# Patient Record
Sex: Female | Born: 1945 | Race: White | Hispanic: No | Marital: Married | Smoking: Never smoker
Health system: Southern US, Community
[De-identification: ages and names within clinical notes are randomized; demographics above are authoritative.]

## PROBLEM LIST (undated history)

## (undated) DIAGNOSIS — G44219 Episodic tension-type headache, not intractable: Secondary | ICD-10-CM

## (undated) DIAGNOSIS — E7800 Pure hypercholesterolemia, unspecified: Secondary | ICD-10-CM

## (undated) DIAGNOSIS — E785 Hyperlipidemia, unspecified: Secondary | ICD-10-CM

## (undated) DIAGNOSIS — E559 Vitamin D deficiency, unspecified: Secondary | ICD-10-CM

## (undated) DIAGNOSIS — J45909 Unspecified asthma, uncomplicated: Secondary | ICD-10-CM

## (undated) DIAGNOSIS — G250 Essential tremor: Secondary | ICD-10-CM

## (undated) DIAGNOSIS — R5381 Other malaise: Secondary | ICD-10-CM

## (undated) HISTORY — DX: Other malaise: R53.81

## (undated) HISTORY — DX: Hyperlipidemia, unspecified: E78.5

## (undated) HISTORY — DX: Essential tremor: G25.0

## (undated) HISTORY — PX: TONSILLECTOMY: SUR1361

## (undated) HISTORY — DX: Unspecified asthma, uncomplicated: J45.909

## (undated) HISTORY — DX: Episodic tension-type headache, not intractable: G44.219

## (undated) HISTORY — DX: Vitamin D deficiency, unspecified: E55.9

---

## 2011-11-16 DIAGNOSIS — M129 Arthropathy, unspecified: Secondary | ICD-10-CM | POA: Insufficient documentation

## 2011-11-16 DIAGNOSIS — E785 Hyperlipidemia, unspecified: Secondary | ICD-10-CM | POA: Insufficient documentation

## 2013-02-23 DIAGNOSIS — J4520 Mild intermittent asthma, uncomplicated: Secondary | ICD-10-CM | POA: Insufficient documentation

## 2013-02-23 DIAGNOSIS — J3089 Other allergic rhinitis: Secondary | ICD-10-CM | POA: Insufficient documentation

## 2017-12-28 DIAGNOSIS — G250 Essential tremor: Secondary | ICD-10-CM | POA: Insufficient documentation

## 2018-07-24 DIAGNOSIS — E559 Vitamin D deficiency, unspecified: Secondary | ICD-10-CM | POA: Insufficient documentation

## 2018-07-24 DIAGNOSIS — R5381 Other malaise: Secondary | ICD-10-CM | POA: Insufficient documentation

## 2018-09-18 ENCOUNTER — Emergency Department (HOSPITAL_BASED_OUTPATIENT_CLINIC_OR_DEPARTMENT_OTHER)
Admission: EM | Admit: 2018-09-18 | Discharge: 2018-09-18 | Disposition: A | Discharge status: Home / Self Care | Payer: Medicare Other | Attending: Emergency Medicine | Admitting: Emergency Medicine

## 2018-09-18 ENCOUNTER — Encounter (HOSPITAL_BASED_OUTPATIENT_CLINIC_OR_DEPARTMENT_OTHER): Payer: Self-pay | Admitting: *Deleted

## 2018-09-18 ENCOUNTER — Other Ambulatory Visit: Payer: Self-pay

## 2018-09-18 ENCOUNTER — Emergency Department (HOSPITAL_BASED_OUTPATIENT_CLINIC_OR_DEPARTMENT_OTHER): Payer: Medicare Other

## 2018-09-18 DIAGNOSIS — Y9373 Activity, racquet and hand sports: Secondary | ICD-10-CM | POA: Insufficient documentation

## 2018-09-18 DIAGNOSIS — S6992XA Unspecified injury of left wrist, hand and finger(s), initial encounter: Secondary | ICD-10-CM | POA: Diagnosis present

## 2018-09-18 DIAGNOSIS — S62115A Nondisplaced fracture of triquetrum [cuneiform] bone, left wrist, initial encounter for closed fracture: Secondary | ICD-10-CM | POA: Diagnosis not present

## 2018-09-18 DIAGNOSIS — S32030A Wedge compression fracture of third lumbar vertebra, initial encounter for closed fracture: Secondary | ICD-10-CM | POA: Insufficient documentation

## 2018-09-18 DIAGNOSIS — W19XXXA Unspecified fall, initial encounter: Secondary | ICD-10-CM

## 2018-09-18 DIAGNOSIS — W010XXA Fall on same level from slipping, tripping and stumbling without subsequent striking against object, initial encounter: Secondary | ICD-10-CM | POA: Insufficient documentation

## 2018-09-18 DIAGNOSIS — Y92312 Tennis court as the place of occurrence of the external cause: Secondary | ICD-10-CM | POA: Insufficient documentation

## 2018-09-18 DIAGNOSIS — Y999 Unspecified external cause status: Secondary | ICD-10-CM | POA: Insufficient documentation

## 2018-09-18 HISTORY — DX: Pure hypercholesterolemia, unspecified: E78.00

## 2018-09-18 MED ORDER — ONDANSETRON 4 MG PO TBDP
4.0000 mg | ORAL_TABLET | Freq: Once | ORAL | Status: AC
  Administered 2018-09-18: 4 mg via ORAL
  Filled 2018-09-18: qty 1

## 2018-09-18 MED ORDER — IBUPROFEN 400 MG PO TABS
400.0000 mg | ORAL_TABLET | Freq: Once | ORAL | Status: AC
  Administered 2018-09-18: 400 mg via ORAL
  Filled 2018-09-18: qty 1

## 2018-09-18 MED ORDER — ACETAMINOPHEN 325 MG PO TABS
650.0000 mg | ORAL_TABLET | Freq: Once | ORAL | Status: AC
  Administered 2018-09-18: 650 mg via ORAL
  Filled 2018-09-18: qty 2

## 2018-09-18 NOTE — ED Notes (Signed)
Patient transported to X-ray 

## 2018-09-18 NOTE — ED Notes (Signed)
ED Provider at bedside. 

## 2018-09-18 NOTE — ED Triage Notes (Signed)
She was playing tennis today and her feet got tangled up and she fell backward. Pain to the back of her head, her mid back and her left wrist. No LOC.

## 2018-09-18 NOTE — Discharge Instructions (Signed)
Take 2 ibuprofen and 2 extra strength tylenol 3 times a day for pain control

## 2018-09-18 NOTE — ED Provider Notes (Signed)
[facility] EMERGENCY DEPARTMENT Provider Note   CSN: [id number] Arrival date & time: 09/18/18  1609     History   Chief Complaint Chief Complaint  Patient presents with  . Fall    HPI Briana Lee is a 72 y.o. female.  The history is provided by the patient.  Fall  This is a new problem. The current episode started 6 to 12 hours ago. The problem occurs constantly. The problem has been gradually worsening. Pertinent negatives include no abdominal pain, no headaches and no shortness of breath. Associated symptoms comments: Back pain and left hand/wrist pain.  Patient is a healthy 72 year old female presenting today after she was playing tennis and she stepped backwards tripping causing her to fall backwards onto her back and left hand.  Since that time she has had significant pain that is only worsened throughout the day.  She did not have any loss of consciousness and denies any chest pain, shortness of breath or abdominal pain.  She denies any numbness or tingling in her legs or difficulty walking..    Past Medical History:  Diagnosis Date  . High cholesterol     There are no active problems to display for this patient.   Past Surgical History:  Procedure Laterality Date  . TONSILLECTOMY       OB History   No obstetric history on file.      Home Medications    Prior to Admission medications   Medication Sig Start Date End Date Taking? Authorizing Provider  atorvastatin (LIPITOR) 20 MG tablet Take 20 mg by mouth daily.   Yes [provider]    Family History No family history on file.  Social History Social History   Tobacco Use  . Smoking status: Never Smoker  . Smokeless tobacco: Never Used  Substance Use Topics  . Alcohol use: Never    Frequency: Never  . Drug use: Never     Allergies   Codeine; Penicillins; and Sulfa antibiotics   Review of Systems Review of Systems  Respiratory: Negative for shortness of breath.     Gastrointestinal: Negative for abdominal pain.  Neurological: Negative for headaches.  All other systems reviewed and are negative.    Physical Exam Updated Vital Signs BP (!) 184/92 (BP Location: Left Arm)   Pulse 76   Temp 98.2 F (36.8 C) (Oral)   Resp 16   Ht 5\' 6"  (1.676 m)   Wt 72.6 kg   SpO2 100%   BMI 25.82 kg/m   Physical Exam Vitals signs and nursing note reviewed.  Constitutional:      General: She is not in acute distress.    Appearance: Normal appearance. She is normal weight.  HENT:     Head: Normocephalic.  Eyes:     Pupils: Pupils are equal, round, and reactive to light.  Neck:     Musculoskeletal: Normal range of motion and neck supple. No muscular tenderness.  Cardiovascular:     Rate and Rhythm: Normal rate.     Pulses: Normal pulses.  Pulmonary:     Effort: Pulmonary effort is normal.  Abdominal:     General: Abdomen is flat. There is no distension.     Tenderness: There is no abdominal tenderness. There is no right CVA tenderness, left CVA tenderness, guarding or rebound.  Musculoskeletal:     Left wrist: Normal.     Lumbar back: She exhibits bony tenderness and pain. She exhibits normal range of motion, no spasm and  normal pulse.       Back:     Left hand: She exhibits tenderness and bony tenderness. She exhibits normal range of motion, normal capillary refill and no deformity.       Hands:  Skin:    General: Skin is warm.     Capillary Refill: Capillary refill takes less than 2 seconds.  Neurological:     General: No focal deficit present.     Mental Status: She is alert. Mental status is at baseline.     Sensory: No sensory deficit.     Motor: No weakness.     Gait: Gait normal.  Psychiatric:        Mood and Affect: Mood normal.        Behavior: Behavior normal.      ED Treatments / Results  Labs (all labs ordered are listed, but only abnormal results are displayed) Labs Reviewed - No data to  display  EKG None  Radiology Dg Lumbar Spine Complete  Result Date: 09/18/2018 CLINICAL DATA:  Pain after fall EXAM: LUMBAR SPINE - COMPLETE 4+ VIEW COMPARISON:  None. FINDINGS: Five non rib-bearing lumbar type vertebra. Moderate to marked degenerative change L5-S1. Suspected acute superior endplate fracture at L3. Age indeterminate moderate to marked compression fracture T12 with close to 50% loss of height anteriorly. Mild kyphosis at this level. 3 mm retropulsion. IMPRESSION: 1. Age indeterminate moderate to marked compression fracture T12 with 3 mm retropulsion 2. Suspected acute superior endplate fracture at L3 Electronically Signed   By: Jasmine PangKim  Fujinaga M.D.   On: 09/18/2018 18:47   Dg Wrist Complete Left  Result Date: 09/18/2018 CLINICAL DATA:  Fall. EXAM: LEFT WRIST - COMPLETE 3+ VIEW COMPARISON:  None. FINDINGS: Ossific fragment dorsal to the carpal bones, likely a small triquetrum fracture. No additional fracture. No dislocation. Joint spaces are preserved. Osteopenia. Mild dorsal wrist soft tissue swelling. IMPRESSION: 1. Small triquetrum fracture with overlying soft tissue swelling. Electronically Signed   By: Obie DredgeWilliam T Derry M.D.   On: 09/18/2018 17:17    Procedures Procedures (including critical care time)  Medications Ordered in ED Medications  acetaminophen (TYLENOL) tablet 650 mg (650 mg Oral Given 09/18/18 1628)  ibuprofen (ADVIL,MOTRIN) tablet 400 mg (400 mg Oral Given 09/18/18 1628)  ondansetron (ZOFRAN-ODT) disintegrating tablet 4 mg (4 mg Oral Given 09/18/18 1628)     Initial Impression / Assessment and Plan / ED Course  I have reviewed the triage vital signs and the nursing notes.  Pertinent labs & imaging results that were available during my care of the patient were reviewed by me and considered in my medical decision making (see chart for details).     Patient with a fall today when she was stepping backward and tripped over the carpet.  She has had back  pain and left hand pain since that time.  sHe is neurovascularly intact at this time and denies any head injury or loss of consciousness.  X-rays today show a suspected acute superior endplate fracture of L3 which is exactly where she is having point tenderness.  It also showed a moderate to marked compression fracture of T12 with 3 mm retropulsion but she states she injured that years ago from falling down the stairs and has no tenderness in that location.  Also x-ray of the hand showed a nondisplaced small triquetral fracture and patient was placed in a splint.  She was able to ambulate and no numbness or weakness of the legs.  She had significant  improvement after ibuprofen and Tylenol.  She will follow-up with Dr. Pearletha ForgeHudnall for casting and further evaluation of her back to ensure it is getting better.  Final Clinical Impressions(s) / ED Diagnoses   Final diagnoses:  Fall, initial encounter  Closed nondisplaced fracture of triquetrum of left wrist, initial encounter  Closed wedge compression fracture of third lumbar vertebra, initial encounter [facility])    ED Discharge Orders    None       Gwyneth SproutPlunkett, Kamry Faraci, MD 09/18/18 [phone]

## 2018-09-20 ENCOUNTER — Encounter: Payer: Self-pay | Admitting: Family Medicine

## 2018-09-20 ENCOUNTER — Ambulatory Visit: Payer: Medicare Other | Admitting: Family Medicine

## 2018-09-20 ENCOUNTER — Other Ambulatory Visit: Payer: Self-pay | Admitting: Family Medicine

## 2018-09-20 VITALS — BP 154/83 | HR 94 | Ht 66.0 in | Wt 160.0 lb

## 2018-09-20 DIAGNOSIS — S6992XA Unspecified injury of left wrist, hand and finger(s), initial encounter: Secondary | ICD-10-CM

## 2018-09-20 DIAGNOSIS — S32030A Wedge compression fracture of third lumbar vertebra, initial encounter for closed fracture: Secondary | ICD-10-CM | POA: Diagnosis not present

## 2018-09-20 MED ORDER — MELOXICAM 7.5 MG PO TABS: 7.5000 mg | ORAL_TABLET | Freq: Every day | ORAL | 1 refills | Status: DC

## 2018-09-20 NOTE — Patient Instructions (Signed)
You have an avulsion fracture of your triquetrum. These typically heal very well with just a wrist brace over 4-6 weeks. Ok to take this off to wash the area and ice it. Icing 15 minutes at a time 3-4 times a day. Elevate above your heart when possible for the swelling.  You have a compression fracture of your lumbar spine. Tylenol 500mg  1-2 tabs three times a day. Try meloxicam 7.5mg  daily with food for pain and inflammation. Salon pas patches to help as well. Consider aspercreme and/or biofreeze topically also. Follow up with me in 2 weeks - we will repeat x-rays on your back at that time.  You don't need repeat x-rays on your wrist.

## 2018-09-21 ENCOUNTER — Encounter: Payer: Self-pay | Admitting: Family Medicine

## 2018-09-21 NOTE — Progress Notes (Signed)
PCP: Mattie MarlinWillett, Ralph, MD  Subjective:   HPI: Patient is a 73 y.o. female here for left wrist injury.  Patient reports on 12/31 she was playing tennis, moving backwards when she tripped and fell sustaining FOOSH injury to left wrist but also fell directly onto low back and struck her head. Pain in left wrist dorsally is 2/10 level, a soreness. Pain in low back is worse, hard to get comfortable. Taking tylenol and ibuprofen which help some but th eibuprofen is bothering her stomach. No radiation into extremities. No numbness or tingling. No bowel/bladder dysfunction. Bruising of left wrist. Wearing wrist splint.  Past Medical History:  Diagnosis Date  . High cholesterol     Current Outpatient Medications on File Prior to Visit  Medication Sig Dispense Refill  . atorvastatin (LIPITOR) 20 MG tablet Take 20 mg by mouth daily.    . cetirizine (ZYRTEC) 10 MG tablet Take 10 mg by mouth daily.     No current facility-administered medications on file prior to visit.     Past Surgical History:  Procedure Laterality Date  . TONSILLECTOMY      Allergies  Allergen Reactions  . Codeine Nausea And Vomiting  . Penicillins     unknown  . Sulfa Antibiotics     rash    Social History   Socioeconomic History  . Marital status: Married    Spouse name: Not on file  . Number of children: Not on file  . Years of education: Not on file  . Highest education level: Not on file  Occupational History  . Not on file  Social Needs  . Financial resource strain: Not on file  . Food insecurity:    Worry: Not on file    Inability: Not on file  . Transportation needs:    Medical: Not on file    Non-medical: Not on file  Tobacco Use  . Smoking status: Never Smoker  . Smokeless tobacco: Never Used  Substance and Sexual Activity  . Alcohol use: Never    Frequency: Never  . Drug use: Never  . Sexual activity: Not on file  Lifestyle  . Physical activity:    Days per week: Not on file   Minutes per session: Not on file  . Stress: Not on file  Relationships  . Social connections:    Talks on phone: Not on file    Gets together: Not on file    Attends religious service: Not on file    Active member of club or organization: Not on file    Attends meetings of clubs or organizations: Not on file    Relationship status: Not on file  . Intimate partner violence:    Fear of current or ex partner: Not on file    Emotionally abused: Not on file    Physically abused: Not on file    Forced sexual activity: Not on file  Other Topics Concern  . Not on file  Social History Narrative  . Not on file    History reviewed. No pertinent family history.  BP (!) 154/83   Pulse 94   Ht 5\' 6"  (1.676 m)   Wt 160 lb (72.6 kg)   BMI 25.82 kg/m   Review of Systems: See HPI above.     Objective:  Physical Exam:  Gen: NAD, comfortable in exam room  Left wrist: Splint removed. No swelling.  Bruising throughout dorsal wrist, hand, distal forearm.  No malrotation or angulation of digits or wrist. Wrist  extension 20 degrees, flexion 15.  5/5 strength all directions and with digits.  FROM digits. TTP dorsally including over triquetrum.  No snuffbox, distal radius or ulna tenderness. NVI distally.  Right wrist: No deformity. FROM with 5/5 strength. No tenderness to palpation. NVI distally.  Low Back: No gross deformity, scoliosis. TTP midline at L3 level.  No paraspinal, other midline tenderness. Strength LEs 5/5 all muscle groups.   Negative SLRs. Sensation intact to light touch bilaterally. Negative logroll bilateral hips   Assessment & Plan:  1. Left wrist injury - independently reviewed radiographs noting small dorsal avulsion of triquetrum.  Expect this to heal well with conservative care over 4-6 weeks.  Wrist brace.  Icing, elevation.  Tylenol, meloxicam.  2. Lumbar spine L3 compression fracture - independently reviewed radiographs noting mild compression without  retropulsion.  T12 compression is old per patient.  Tylenol, meloxicam.  Salon pas, discussed topical medications also.  F/u in 2 weeks.  Repeat lumbar spine radiographs then.

## 2018-10-05 ENCOUNTER — Ambulatory Visit: Payer: Medicare Other | Admitting: Family Medicine

## 2018-10-05 ENCOUNTER — Ambulatory Visit (HOSPITAL_BASED_OUTPATIENT_CLINIC_OR_DEPARTMENT_OTHER)
Admission: RE | Admit: 2018-10-05 | Discharge: 2018-10-05 | Disposition: A | Discharge status: Home / Self Care | Payer: Medicare Other | Source: Ambulatory Visit | Attending: Family Medicine | Admitting: Family Medicine

## 2018-10-05 ENCOUNTER — Encounter: Payer: Self-pay | Admitting: Family Medicine

## 2018-10-05 VITALS — BP 161/86 | HR 78 | Ht 66.0 in | Wt 160.0 lb

## 2018-10-05 DIAGNOSIS — S6992XD Unspecified injury of left wrist, hand and finger(s), subsequent encounter: Secondary | ICD-10-CM | POA: Diagnosis not present

## 2018-10-05 DIAGNOSIS — S3992XD Unspecified injury of lower back, subsequent encounter: Secondary | ICD-10-CM | POA: Insufficient documentation

## 2018-10-05 NOTE — Patient Instructions (Signed)
Wear the wrist brace for 2 more weeks then you can discontinue this.  You have evidence of two compression fractures of your back. Tylenol 500mg  1-2 tabs three times a day. Try meloxicam 7.5mg  daily with food for pain and inflammation. Salon pas patches to help as well. Consider aspercreme and/or biofreeze topically also. Follow up with me in 4 weeks for reevaluation. It's ok for you to drive provided you can slam on the brakes if needed. I would refrain from tennis for the next 4 weeks - stationary bike, walking is ok for exercise.

## 2018-10-06 ENCOUNTER — Encounter: Payer: Self-pay | Admitting: Family Medicine

## 2018-10-06 NOTE — Progress Notes (Signed)
PCP: Mattie Marlin, MD  Subjective:   HPI: Patient is a 73 y.o. female here for left wrist injury.  1/2: Patient reports on 12/31 she was playing tennis, moving backwards when she tripped and fell sustaining FOOSH injury to left wrist but also fell directly onto low back and struck her head. Pain in left wrist dorsally is 2/10 level, a soreness. Pain in low back is worse, hard to get comfortable. Taking tylenol and ibuprofen which help some but th eibuprofen is bothering her stomach. No radiation into extremities. No numbness or tingling. No bowel/bladder dysfunction. Bruising of left wrist. Wearing wrist splint.  1/17: Patient reports her wrist feels much better. Some pain in the morning - wearing wrist brace. Pain currently 0/10. Her back has improved also to 4/10 level. Taking tylenol 2 tabs three times a day with meloxicam daily. Some tingling dorsal left foot only, not consistent. No bowel/bladder dysfunction.  Past Medical History:  Diagnosis Date  . High cholesterol     Current Outpatient Medications on File Prior to Visit  Medication Sig Dispense Refill  . albuterol (PROVENTIL HFA;VENTOLIN HFA) 108 (90 Base) MCG/ACT inhaler INHALE 1 TO 2 PUFFS BY MOUTH EVERY 4 TO 6 HOURS AS NEEDED    . propranolol (INDERAL) 10 MG tablet Take by mouth.    Marland Kitchen atorvastatin (LIPITOR) 20 MG tablet Take 20 mg by mouth daily.    . cetirizine (ZYRTEC) 10 MG tablet Take 10 mg by mouth daily.    . meloxicam (MOBIC) 7.5 MG tablet Take 1 tablet (7.5 mg total) by mouth daily. 30 tablet 1   No current facility-administered medications on file prior to visit.     Past Surgical History:  Procedure Laterality Date  . TONSILLECTOMY      Allergies  Allergen Reactions  . Codeine Nausea And Vomiting  . Penicillins     unknown  . Sulfa Antibiotics     rash    Social History   Socioeconomic History  . Marital status: Married    Spouse name: Not on file  . Number of children: Not on file   . Years of education: Not on file  . Highest education level: Not on file  Occupational History  . Not on file  Social Needs  . Financial resource strain: Not on file  . Food insecurity:    Worry: Not on file    Inability: Not on file  . Transportation needs:    Medical: Not on file    Non-medical: Not on file  Tobacco Use  . Smoking status: Never Smoker  . Smokeless tobacco: Never Used  Substance and Sexual Activity  . Alcohol use: Never    Frequency: Never  . Drug use: Never  . Sexual activity: Not on file  Lifestyle  . Physical activity:    Days per week: Not on file    Minutes per session: Not on file  . Stress: Not on file  Relationships  . Social connections:    Talks on phone: Not on file    Gets together: Not on file    Attends religious service: Not on file    Active member of club or organization: Not on file    Attends meetings of clubs or organizations: Not on file    Relationship status: Not on file  . Intimate partner violence:    Fear of current or ex partner: Not on file    Emotionally abused: Not on file    Physically abused: Not on  file    Forced sexual activity: Not on file  Other Topics Concern  . Not on file  Social History Narrative  . Not on file    History reviewed. No pertinent family history.  BP (!) 161/86   Pulse 78   Ht 5\' 6"  (1.676 m)   Wt 160 lb (72.6 kg)   BMI 25.82 kg/m   Review of Systems: See HPI above.     Objective:  Physical Exam:  Gen: NAD, comfortable in exam room  Left wrist: Brace removed. No deformity. Lacks about 5 degrees extension compared to right.  5/5 strength all motions. No tenderness to palpation. NVI distally.  Back: No gross deformity, scoliosis. No TTP currently.  No midline or bony TTP. FROM. Strength LEs 5/5 all muscle groups.   2+ MSRs in patellar and achilles tendons, equal bilaterally. Negative SLRs. Sensation intact to light touch bilaterally. Negative logroll bilateral hips    Assessment & Plan:  1. Left wrist injury - small dorsal avulsion fracture of triquetrum.  Continue wrist brace for 2 more weeks then discontinue.  Tylenol, meloxicam if needed for this.  2. Lumbar spine compression fractures - independently reviewed repeat radiographs today and actually has two new compression fractures - L1 and L3 - no bony retropulsion at these levels.  No red flags.  Tylenol, meloxicam, salon pas patches.  F/u in 4 weeks.  Stationary bike, walking for exercise.

## 2018-11-02 ENCOUNTER — Ambulatory Visit: Payer: Medicare Other | Admitting: Family Medicine

## 2018-11-02 ENCOUNTER — Encounter: Payer: Self-pay | Admitting: Family Medicine

## 2018-11-02 VITALS — BP 146/82 | HR 83 | Ht 66.0 in | Wt 160.0 lb

## 2018-11-02 DIAGNOSIS — S32000S Wedge compression fracture of unspecified lumbar vertebra, sequela: Secondary | ICD-10-CM | POA: Diagnosis not present

## 2018-11-02 NOTE — Patient Instructions (Signed)
Continue your vitamin D. Start physical therapy for your back s/p compression fracture lumbar spine. We will set up a bone density test/DEXA for you. We will also refer you to endocrinology to discuss options for treatment - let them know reaction you had to boniva and calcium - hopefully there's a medication you can take to minimize your risk of fracture. Follow up with me in 6 weeks.

## 2018-11-03 ENCOUNTER — Encounter: Payer: Self-pay | Admitting: Family Medicine

## 2018-11-03 NOTE — Progress Notes (Signed)
PCP: Mattie Marlin, MD  Subjective:   HPI: Patient is a 73 y.o. female here for low back injury.  1/2: Patient reports on 12/31 she was playing tennis, moving backwards when she tripped and fell sustaining FOOSH injury to left wrist but also fell directly onto low back and struck her head. Pain in left wrist dorsally is 2/10 level, a soreness. Pain in low back is worse, hard to get comfortable. Taking tylenol and ibuprofen which help some but th eibuprofen is bothering her stomach. No radiation into extremities. No numbness or tingling. No bowel/bladder dysfunction. Bruising of left wrist. Wearing wrist splint.  1/17: Patient reports her wrist feels much better. Some pain in the morning - wearing wrist brace. Pain currently 0/10. Her back has improved also to 4/10 level. Taking tylenol 2 tabs three times a day with meloxicam daily. Some tingling dorsal left foot only, not consistent. No bowel/bladder dysfunction.  2/14: Patient reports her wrist has improved, no current issues. Her low back is improving now 6 weeks out but with 2/10 pain, soreness center of low back. No radiation into extremities. No bowel/bladder dysfunction. Taking tylenol as needed - last took this morning with benefit. Interested in doing physical therapy.  Past Medical History:  Diagnosis Date  . High cholesterol     Current Outpatient Medications on File Prior to Visit  Medication Sig Dispense Refill  . albuterol (PROVENTIL HFA;VENTOLIN HFA) 108 (90 Base) MCG/ACT inhaler INHALE 1 TO 2 PUFFS BY MOUTH EVERY 4 TO 6 HOURS AS NEEDED    . atorvastatin (LIPITOR) 20 MG tablet Take 20 mg by mouth daily.    . cetirizine (ZYRTEC) 10 MG tablet Take 10 mg by mouth daily.    . meloxicam (MOBIC) 7.5 MG tablet Take 1 tablet (7.5 mg total) by mouth daily. 30 tablet 1  . propranolol (INDERAL) 10 MG tablet Take by mouth.    . Vitamin D, Ergocalciferol, (DRISDOL) 1.25 MG (50000 UT) CAPS capsule TK 1 C PO 1 TIME A WK      No current facility-administered medications on file prior to visit.     Past Surgical History:  Procedure Laterality Date  . TONSILLECTOMY      Allergies  Allergen Reactions  . Codeine Nausea And Vomiting  . Penicillins     unknown  . Sulfa Antibiotics     rash    Social History   Socioeconomic History  . Marital status: Married    Spouse name: Not on file  . Number of children: Not on file  . Years of education: Not on file  . Highest education level: Not on file  Occupational History  . Not on file  Social Needs  . Financial resource strain: Not on file  . Food insecurity:    Worry: Not on file    Inability: Not on file  . Transportation needs:    Medical: Not on file    Non-medical: Not on file  Tobacco Use  . Smoking status: Never Smoker  . Smokeless tobacco: Never Used  Substance and Sexual Activity  . Alcohol use: Never    Frequency: Never  . Drug use: Never  . Sexual activity: Not on file  Lifestyle  . Physical activity:    Days per week: Not on file    Minutes per session: Not on file  . Stress: Not on file  Relationships  . Social connections:    Talks on phone: Not on file    Gets together: Not on  file    Attends religious service: Not on file    Active member of club or organization: Not on file    Attends meetings of clubs or organizations: Not on file    Relationship status: Not on file  . Intimate partner violence:    Fear of current or ex partner: Not on file    Emotionally abused: Not on file    Physically abused: Not on file    Forced sexual activity: Not on file  Other Topics Concern  . Not on file  Social History Narrative  . Not on file    History reviewed. No pertinent family history.  BP (!) 146/82   Pulse 83   Ht 5\' 6"  (1.676 m)   Wt 160 lb (72.6 kg)   BMI 25.82 kg/m   Review of Systems: See HPI above.     Objective:  Physical Exam:  Gen: NAD, comfortable in exam room  Back: No gross deformity,  scoliosis. No paraspinal TTP .  No midline or bony TTP. FROM. Strength LEs 5/5 all muscle groups.   2+ MSRs in patellar and achilles tendons, equal bilaterally. Negative SLRs. Sensation intact to light touch bilaterally.  Bilateral hips: No deformity. FROM with 5/5 strength. No tenderness to palpation. NVI distally. Negative logroll.   Assessment & Plan:  1. Lumbar spine compression fractures - Radiographs with compression fractures at L1 and L3.  Improving without red flag symptoms, reassuring exam.  Continue tylenol, vitamin D.  Start physical therapy for strengthening.  Will go ahead with DEXA and refer to endocrinology.  She had intolerance to boniva in the past and reports she's been advised not to take calcium - had a stent after she supplemented with this, reports a high calcium score from cardiology.  F/u in 6 weeks.

## 2018-11-05 NOTE — Addendum Note (Signed)
Addended by: Kathi Simpers F on: 11/05/2018 09:01 AM   Modules accepted: Orders

## 2018-11-05 NOTE — Addendum Note (Signed)
Addended by: Kathi Simpers F on: 11/05/2018 09:06 AM   Modules accepted: Orders

## 2018-11-07 ENCOUNTER — Ambulatory Visit (HOSPITAL_BASED_OUTPATIENT_CLINIC_OR_DEPARTMENT_OTHER)
Admission: RE | Admit: 2018-11-07 | Discharge: 2018-11-07 | Disposition: A | Discharge status: Home / Self Care | Payer: Medicare Other | Source: Ambulatory Visit | Attending: Family Medicine | Admitting: Family Medicine

## 2018-11-07 ENCOUNTER — Encounter (INDEPENDENT_AMBULATORY_CARE_PROVIDER_SITE_OTHER): Payer: Self-pay

## 2018-11-07 DIAGNOSIS — M810 Age-related osteoporosis without current pathological fracture: Secondary | ICD-10-CM | POA: Diagnosis not present

## 2018-11-07 DIAGNOSIS — M85851 Other specified disorders of bone density and structure, right thigh: Secondary | ICD-10-CM | POA: Diagnosis not present

## 2018-11-07 DIAGNOSIS — S32000S Wedge compression fracture of unspecified lumbar vertebra, sequela: Secondary | ICD-10-CM | POA: Insufficient documentation

## 2018-11-17 ENCOUNTER — Other Ambulatory Visit: Payer: Self-pay | Admitting: Family Medicine

## 2018-11-19 ENCOUNTER — Other Ambulatory Visit: Payer: Self-pay | Admitting: Family Medicine

## 2018-12-14 ENCOUNTER — Ambulatory Visit: Payer: Medicare Other | Admitting: Family Medicine

## 2019-03-07 DIAGNOSIS — G44219 Episodic tension-type headache, not intractable: Secondary | ICD-10-CM | POA: Insufficient documentation

## 2019-03-23 ENCOUNTER — Other Ambulatory Visit: Payer: Self-pay

## 2019-03-23 ENCOUNTER — Emergency Department (HOSPITAL_BASED_OUTPATIENT_CLINIC_OR_DEPARTMENT_OTHER)
Admission: EM | Admit: 2019-03-23 | Discharge: 2019-03-23 | Disposition: A | Discharge status: Home / Self Care | Payer: Medicare Other | Attending: Emergency Medicine | Admitting: Emergency Medicine

## 2019-03-23 ENCOUNTER — Encounter (HOSPITAL_BASED_OUTPATIENT_CLINIC_OR_DEPARTMENT_OTHER): Payer: Self-pay | Admitting: Emergency Medicine

## 2019-03-23 DIAGNOSIS — Z79899 Other long term (current) drug therapy: Secondary | ICD-10-CM | POA: Insufficient documentation

## 2019-03-23 DIAGNOSIS — R51 Headache: Secondary | ICD-10-CM | POA: Insufficient documentation

## 2019-03-23 DIAGNOSIS — R519 Headache, unspecified: Secondary | ICD-10-CM

## 2019-03-23 LAB — CBC
HCT: 45.7 % (ref 36.0–46.0)
Hemoglobin: 14.6 g/dL (ref 12.0–15.0)
MCH: 29.6 pg (ref 26.0–34.0)
MCHC: 31.9 g/dL (ref 30.0–36.0)
MCV: 92.5 fL (ref 80.0–100.0)
Platelets: 190 10*3/uL (ref 150–400)
RBC: 4.94 MIL/uL (ref 3.87–5.11)
RDW: 12.7 % (ref 11.5–15.5)
WBC: 5.8 10*3/uL (ref 4.0–10.5)
nRBC: 0 % (ref 0.0–0.2)

## 2019-03-23 LAB — COMPREHENSIVE METABOLIC PANEL
ALT: 14 U/L (ref 0–44)
AST: 20 U/L (ref 15–41)
Albumin: 3.9 g/dL (ref 3.5–5.0)
Alkaline Phosphatase: 61 U/L (ref 38–126)
Anion gap: 11 (ref 5–15)
BUN: 9 mg/dL (ref 8–23)
CO2: 26 mmol/L (ref 22–32)
Calcium: 9.2 mg/dL (ref 8.9–10.3)
Chloride: 104 mmol/L (ref 98–111)
Creatinine, Ser: 0.69 mg/dL (ref 0.44–1.00)
GFR calc Af Amer: >60 mL/min (ref >60)
GFR calc non Af Amer: >60 mL/min (ref >60)
Glucose, Bld: 102 mg/dL — ABNORMAL HIGH (ref 70–99)
Potassium: 3.7 mmol/L (ref 3.5–5.1)
Sodium: 141 mmol/L (ref 135–145)
Total Bilirubin: 1.3 mg/dL — ABNORMAL HIGH (ref 0.3–1.2)
Total Protein: 7.5 g/dL (ref 6.5–8.1)

## 2019-03-23 LAB — URINALYSIS, MICROSCOPIC (REFLEX)
RBC / HPF: NONE SEEN RBC/hpf (ref 0–5)
WBC, UA: NONE SEEN WBC/hpf (ref 0–5)

## 2019-03-23 LAB — URINALYSIS, ROUTINE W REFLEX MICROSCOPIC
Bilirubin Urine: NEGATIVE
Glucose, UA: NEGATIVE mg/dL
Ketones, ur: 15 mg/dL — AB
Leukocytes,Ua: NEGATIVE
Nitrite: NEGATIVE
Protein, ur: NEGATIVE mg/dL
Specific Gravity, Urine: 1.015 (ref 1.005–1.030)
pH: 6.5 (ref 5.0–8.0)

## 2019-03-23 MED ORDER — ONDANSETRON HCL 4 MG/2ML IJ SOLN
4.0000 mg | Freq: Once | INTRAMUSCULAR | Status: AC
  Administered 2019-03-23: 4 mg via INTRAVENOUS
  Filled 2019-03-23: qty 2

## 2019-03-23 MED ORDER — SODIUM CHLORIDE 0.9 % IV BOLUS
1000.0000 mL | Freq: Once | INTRAVENOUS | Status: AC
  Administered 2019-03-23: 1000 mL via INTRAVENOUS

## 2019-03-23 MED ORDER — METOCLOPRAMIDE HCL 5 MG/ML IJ SOLN: 10.0000 mg | Freq: Once | INTRAMUSCULAR | Status: DC

## 2019-03-23 MED ORDER — KETOROLAC TROMETHAMINE 15 MG/ML IJ SOLN
15.0000 mg | Freq: Once | INTRAMUSCULAR | Status: AC
  Administered 2019-03-23: 15 mg via INTRAVENOUS
  Filled 2019-03-23: qty 1

## 2019-03-23 NOTE — ED Triage Notes (Signed)
Pt reports HA x 1 month. Has been seen by PCP for same with multiple treatments and no relief.

## 2019-03-23 NOTE — ED Provider Notes (Signed)
[facility] EMERGENCY DEPARTMENT Provider Note   CSN: [id number] Arrival date & time: 03/23/19  0807     History   Chief Complaint Chief Complaint  Patient presents with  . Headache    HPI Briana Lee is a 73 y.o. female.     HPI Patient is a 73 year old female who reports persistent headache over the past month despite attempts at over-the-counter medications.  No reports of fevers or chills.  Denies neck pain or neck stiffness.  No weakness of her arms or legs.  Patient underwent CT imaging of her head within the past week which was normal.  She is tried over-the-counter medications without improvement.  No fevers.  Some nausea.  No vomiting.  No diarrhea.  No chest pain or abdominal pain.  No dysuria or urinary frequency.  She states she feels anxious.  She was recently tried on alprazolam but did not tolerate the medication.   Past Medical History:  Diagnosis Date  . High cholesterol     Patient Active Problem List   Diagnosis Date Noted  . Vitamin D deficiency 07/24/2018  . Tremor, essential 12/28/2017  . Mild intermittent asthma without complication 02/23/2013  . Perennial allergic rhinitis with seasonal variation 02/23/2013  . Arthropathy, multiple sites 11/16/2011  . Hyperlipidemia 11/16/2011    Past Surgical History:  Procedure Laterality Date  . TONSILLECTOMY       OB History   No obstetric history on file.      Home Medications    Prior to Admission medications   Medication Sig Start Date End Date Taking? Authorizing Provider  albuterol (PROVENTIL HFA;VENTOLIN HFA) 108 (90 Base) MCG/ACT inhaler INHALE 1 TO 2 PUFFS BY MOUTH EVERY 4 TO 6 HOURS AS NEEDED 09/26/14   [provider]  atorvastatin (LIPITOR) 20 MG tablet Take 20 mg by mouth daily.    [provider]  cetirizine (ZYRTEC) 10 MG tablet Take 10 mg by mouth daily.    [provider]  meloxicam (MOBIC) 7.5 MG tablet TAKE 1 TABLET BY MOUTH DAILY 11/19/18   Hudnall,  Azucena FallenShane R, MD  propranolol (INDERAL) 10 MG tablet Take by mouth. 09/25/18   [provider]  Vitamin D, Ergocalciferol, (DRISDOL) 1.25 MG (50000 UT) CAPS capsule TK 1 C PO 1 TIME A WK 10/20/18   [provider]    Family History No family history on file.  Social History Social History   Tobacco Use  . Smoking status: Never Smoker  . Smokeless tobacco: Never Used  Substance Use Topics  . Alcohol use: Never    Frequency: Never  . Drug use: Never     Allergies   Codeine, Penicillins, and Sulfa antibiotics   Review of Systems Review of Systems  All other systems reviewed and are negative.    Physical Exam Updated Vital Signs BP (!) 182/96 (BP Location: Left Arm)   Pulse 97   Temp 98.2 F (36.8 C) (Oral)   Resp 16   Ht 5\' 6"  (1.676 m)   Wt 67.1 kg   SpO2 99%   BMI 23.89 kg/m   Physical Exam Vitals signs and nursing note reviewed.  Constitutional:      General: She is not in acute distress.    Appearance: She is well-developed.  HENT:     Head: Normocephalic and atraumatic.  Eyes:     Pupils: Pupils are equal, round, and reactive to light.  Neck:     Musculoskeletal: Normal range of motion.  Cardiovascular:  Rate and Rhythm: Normal rate and regular rhythm.     Heart sounds: Normal heart sounds.  Pulmonary:     Effort: Pulmonary effort is normal.     Breath sounds: Normal breath sounds.  Abdominal:     General: There is no distension.     Palpations: Abdomen is soft.     Tenderness: There is no abdominal tenderness.  Musculoskeletal: Normal range of motion.  Skin:    General: Skin is warm and dry.  Neurological:     Mental Status: She is alert and oriented to person, place, and time.     Coordination: Abnormal coordination:      Comments: 5/5 strength in major muscle groups of  bilateral upper and lower extremities. Speech normal. No facial asymetry.   Psychiatric:        Judgment: Judgment normal.      ED Treatments / Results   Labs (all labs ordered are listed, but only abnormal results are displayed) Labs Reviewed  COMPREHENSIVE METABOLIC PANEL - Abnormal; Notable for the following components:      Result Value   Glucose, Bld 102 (*)    Total Bilirubin 1.3 (*)    All other components within normal limits  URINALYSIS, ROUTINE W REFLEX MICROSCOPIC - Abnormal; Notable for the following components:   Hgb urine dipstick TRACE (*)    Ketones, ur 15 (*)    All other components within normal limits  URINALYSIS, MICROSCOPIC (REFLEX) - Abnormal; Notable for the following components:   Bacteria, UA RARE (*)    All other components within normal limits  CBC    EKG None  Radiology No results found.  Procedures Procedures (including critical care time)  Medications Ordered in ED Medications  sodium chloride 0.9 % bolus 1,000 mL (0 mLs Intravenous Stopped 03/23/19 1013)  ketorolac (TORADOL) 15 MG/ML injection 15 mg (15 mg Intravenous Given 03/23/19 0906)  ondansetron (ZOFRAN) injection 4 mg (4 mg Intravenous Given 03/23/19 1014)     Initial Impression / Assessment and Plan / ED Course  I have reviewed the triage vital signs and the nursing notes.  Pertinent labs & imaging results that were available during my care of the patient were reviewed by me and considered in my medical decision making (see chart for details).        No indication for additional advanced imaging at this time.  CT scan recently performed and normal.  Symptom control here in the emergency department.  Labs and urine without significant abnormality.  IV fluids given.  Plan for discharge from the emergency department.  Note occasion for additional work-up.  Close primary care and neurology follow-up.  Patient understands to return to the ER for new or worsening symptoms  Final Clinical Impressions(s) / ED Diagnoses   Final diagnoses:  Intractable headache, unspecified chronicity pattern, unspecified headache type    ED Discharge Orders     None       Jola Schmidt, MD 03/23/19 1051

## 2019-03-23 NOTE — Discharge Instructions (Addendum)
Please call your primary care physician for follow-up  Will benefit from further evaluation by your neurology team

## 2019-03-25 DIAGNOSIS — G4452 New daily persistent headache (NDPH): Secondary | ICD-10-CM | POA: Insufficient documentation

## 2019-04-02 ENCOUNTER — Ambulatory Visit: Payer: Medicare Other | Admitting: Neurology

## 2019-04-02 ENCOUNTER — Encounter: Payer: Self-pay | Admitting: Neurology

## 2019-04-02 ENCOUNTER — Telehealth: Payer: Self-pay

## 2019-04-02 ENCOUNTER — Other Ambulatory Visit: Payer: Self-pay

## 2019-04-02 VITALS — BP 185/93 | HR 96 | Ht 66.0 in | Wt 145.0 lb

## 2019-04-02 DIAGNOSIS — G44209 Tension-type headache, unspecified, not intractable: Secondary | ICD-10-CM

## 2019-04-02 DIAGNOSIS — R519 Headache, unspecified: Secondary | ICD-10-CM

## 2019-04-02 DIAGNOSIS — G20 Parkinson's disease: Secondary | ICD-10-CM

## 2019-04-02 DIAGNOSIS — Z820 Family history of epilepsy and other diseases of the nervous system: Secondary | ICD-10-CM

## 2019-04-02 DIAGNOSIS — R51 Headache: Secondary | ICD-10-CM

## 2019-04-02 DIAGNOSIS — R251 Tremor, unspecified: Secondary | ICD-10-CM

## 2019-04-02 NOTE — Telephone Encounter (Signed)
Pt declined signing the DaT Scan insurance consent today. She wanted to take it home. She will fax it back if she decides to pursue it. I asked her to fax it to [phone] Attn: Hinton Dyer. Pt verbalized understanding.

## 2019-04-02 NOTE — Patient Instructions (Signed)
I do not think you have classic migraine headaches.  You may very well have tension headache, triggered by stress and neck pain.  Please consider seeing a spine specialist.  Please talk to your primary care physician or nurse practitioner about stress and anxiety management. You have a family history of tremor, I cannot exclude mild parkinsonism affecting your right side.  I would like to proceed with a DaT scan: This is a specialized brain scan designed to help with diagnosis of tremor disorders. A radioactive marker gets injected and the uptake is measured in the brain and compared to normal controls and right side is compared to the left, a change in uptake can help with diagnosis of certain tremor disorders. A brain MRI on the other hand is a brain scan that helps look at the brain structure in more detail overall and look for age-related changes, blood vessel related changes and look for stroke and volume loss which we call atrophy.  I would like for you to think about a brain MRI with and without contrast, it would help rule out a structural cause of your new onset headaches.I would also like to suggest a sleep study to rule out sleep apnea as a potential cause of your new onset headaches and morning headaches. Please let me know if you change your mind on the brain MRI as well as sleep study.  We will proceed with a DaTscan and follow-up after that.

## 2019-04-02 NOTE — Progress Notes (Signed)
Subjective:    Patient ID: Briana Lee is a 73 y.o. female.  HPI     Star Age, MD, PhD [facility] [address] [place], [place] [address]  Dear Juliann Pulse,   I saw your patient, Briana Lee, upon your kind request in my neurologic clinic today for initial consultation of her tremor and recurrent headaches.  The patient is accompanied by her husband today.  As you know, [name] is a 73 year old right-handed woman with an underlying medical history of hyperlipidemia, vitamin D deficiency, asthma, and allergic rhinitis, who reports a worsening tremor affecting primarily her right hand for the past 4 years or so.  She has recently experienced headaches, these have been going on for the past 3 weeks.  She attributes her headaches to stress and anxiety.  She has been seeing Dr. Harrie Foreman out of [facility] and neurology, I was able to request records from his office.  She had seen him in January 2020 for tremor and was started on Inderal low-dose with his, had side effects including feeling lightheaded.  She saw him in follow-up in February 2020 and was advised to start a trial of Mysoline which also caused side effects.  She was then given a prescription for gabapentin which she initially did not take for fear of side effects.  She had then a bout of sinus infection for which she was treated with at least 2 different antibiotics, which then resulted in a yeast infection.  She had seen ENT who suggested that she may have migraine headaches.  She has no history of migraines before and denies any photophobia or nausea or vomiting with her headaches, her headaches are primarily bifrontal and radiate backwards, she has noticed that massage of her neck has helped.  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  She does note that when she went to the emergency room the Toradol helped.  She saw a dentist who felt she needed a root  canal and sent her to an endodontist who advised her that she did not have any tooth abscess or need for tooth extraction.  She did end up trying the gabapentin as encouraged by her ENT from what I understand and had side effects including increase in anxiety, feeling lightheaded, foggy headed.  I reviewed your office note from 03/21/2019.  She reported worsening anxiety and tremors.  She reports a family history of tremors affecting her mother, maternal uncle, maternal aunt.  She has fallen, she was diagnosed with a lumbar compression fracture. She was seen in the emergency room on 03/23/2019 with headache.  She was treated symptomatically with Toradol and Zofran.  She had a head CT without contrast on 03/19/2019 which was reported as negative head CT. She has occasional snoring, usually when she is congested.  In the past couple of weeks she has not been sleeping well.  Her Epworth sleepiness score is 3 out of 24.  She reports a fall in January which resulted in the lumbar compression fracture. She had back pain which improved.  She has never had a sleep study.  She has woken up with a headache and reports having to get up to use the bathroom at night.  She avoids caffeine, she does not drink water very well and her husband adds that he is particularly worried about her lack of appetite and that she does not eat or drink very well, he is trying to encourage  her to drink protein milkshakes such as boost or Ensure.  She had side effects to the lorazepam which she was given for anxiety recently.   Her Past Medical History Is Significant For: Past Medical History:  Diagnosis Date  . Asthma   . Episodic tension-type headache, not intractable   . Essential tremor   . High cholesterol   . Hyperlipemia   . Malaise   . Vitamin D deficiency     Her Past Surgical History Is Significant For: Past Surgical History:  Procedure Laterality Date  . TONSILLECTOMY      Her Family History Is Significant For: No  family history on file.  Her Social History Is Significant For: Social History   Socioeconomic History  . Marital status: Married    Spouse name: Not on file  . Number of children: Not on file  . Years of education: Not on file  . Highest education level: Not on file  Occupational History  . Not on file  Social Needs  . Financial resource strain: Not on file  . Food insecurity    Worry: Not on file    Inability: Not on file  . Transportation needs    Medical: Not on file    Non-medical: Not on file  Tobacco Use  . Smoking status: Never Smoker  . Smokeless tobacco: Never Used  Substance and Sexual Activity  . Alcohol use: Never    Frequency: Never  . Drug use: Never  . Sexual activity: Not on file  Lifestyle  . Physical activity    Days per week: Not on file    Minutes per session: Not on file  . Stress: Not on file  Relationships  . Social Musicianconnections    Talks on phone: Not on file    Gets together: Not on file    Attends religious service: Not on file    Active member of club or organization: Not on file    Attends meetings of clubs or organizations: Not on file    Relationship status: Not on file  Other Topics Concern  . Not on file  Social History Narrative  . Not on file    Her Allergies Are:  Allergies  Allergen Reactions  . Codeine Nausea And Vomiting  . Penicillins     unknown  . Sulfa Antibiotics     rash  :   Her Current Medications Are:  Outpatient Encounter Medications as of 04/02/2019  Medication Sig  . albuterol (PROVENTIL HFA;VENTOLIN HFA) 108 (90 Base) MCG/ACT inhaler INHALE 1 TO 2 PUFFS BY MOUTH EVERY 4 TO 6 HOURS AS NEEDED  . rosuvastatin (CRESTOR) 10 MG tablet Take 10 mg by mouth daily.  . Triamcinolone Acetonide (NASACORT AQ NA) Place into the nose.  . Vitamin D, Ergocalciferol, (DRISDOL) 1.25 MG (50000 UT) CAPS capsule TK 1 C PO 1 TIME A WK  . [DISCONTINUED] cetirizine (ZYRTEC) 10 MG tablet Take 10 mg by mouth daily.  .  [DISCONTINUED] LORazepam (ATIVAN) 0.5 MG tablet Take 0.5 mg by mouth every 8 (eight) hours.  . [DISCONTINUED] meloxicam (MOBIC) 7.5 MG tablet TAKE 1 TABLET BY MOUTH DAILY  . [DISCONTINUED] propranolol (INDERAL) 10 MG tablet Take by mouth.   No facility-administered encounter medications on file as of 04/02/2019.   :   Review of Systems:  Out of a complete 14 point review of systems, all are reviewed and negative with the exception of these symptoms as listed below:  Review of Systems  Neurological:  Pt presents today to discuss her tremor and headaches. She is right handed and notices the tremor in her right hand. She has tried propranolol but it did not help. She is complaining of daily headaches that last all day with some associated light sensitivity. Pt does endorse snoring but has never had a sleep study. Pt reports that she has a CT head recently.  Epworth Sleepiness Scale 0= would never doze 1= slight chance of dozing 2= moderate chance of dozing 3= high chance of dozing  Sitting and reading: 0 Watching TV: 1 Sitting inactive in a public place (ex. Theater or meeting): 0 As a passenger in a car for an hour without a break: 1 Lying down to rest in the afternoon: 1 Sitting and talking to someone: 0 Sitting quietly after lunch (no alcohol): 0 In a car, while stopped in traffic: 0 Total: 3     Objective:  Neurological Exam  Physical Exam Physical Examination:   Vitals:   04/02/19 1101  BP: (!) 185/93  Pulse: 96    General Examination: The patient is a very pleasant 73 y.o. female in no acute distress. She is quite anxious at times.  She has an intermittent lower jaw tremor.Well groomed.   HEENT: Normocephalic, atraumatic, pupils are equal, round and reactive to light and accommodation. Extraocular tracking is well preserved, no nystagmus, no gaze limitation noted.  Hearing is Grossly intact, face is symmetric, perhaps mild facial masking is noted, no obvious lip  or side to side head tremor.  She has perhaps very mild nuchal rigidity.  Shoulder height is equal.  Shoulder shrug symmetrical. There is no hypophonia. Oropharynx exam reveals: moderate mouth dryness, adequate dental hygiene and mild airway crowding, due to small airway. Tongue protrudes centrally and palate elevates symmetrically.   Chest: Clear to auscultation without wheezing, rhonchi or crackles noted.  Heart: S1+S2+0, regular and normal without murmurs, rubs or gallops noted.   Abdomen: Soft, non-tender and non-distended with normal bowel sounds appreciated on auscultation.  Extremities: There is no pitting edema in the distal lower extremities bilaterally. Pedal pulses are intact.  Skin: Warm and dry without trophic changes noted.  Musculoskeletal: exam reveals no obvious joint deformities, tenderness or joint swelling or erythema.   Neurologically:  Mental status: The patient is awake, alert and oriented in all 4 spheres. Her immediate and remote memory, attention, language skills and fund of knowledge are appropriate. There is no evidence of aphasia, agnosia, apraxia or anomia. Speech is clear with normal prosody and enunciation. Thought process is linear. Mood is normal and affect is normal.  Cranial nerves II - XII are as described above under HEENT exam. In addition: shoulder shrug is normal with equal shoulder height noted. Motor exam: Normal bulk, strength and tone is notedWith the exception of mild cogwheeling noted in the right upper extremity.  She has a moderate and at times milder resting tremor in the right upper extremity, no other resting tremor.  She has a minimal postural tremor bilaterally in the upper extremities and no significant action tremor.  On fine motor testing with finger taps and hand movements, there is very slight difficulty noted on the right, otherwise no significant difficulty on the left, lower extremity evaluation with foot taps shows no significant  difficulty on the right or left. On 04/02/2019: On Archimedes spiral drawing she has insecurity with the left hand which is her nondominant hand, with the right she has no significant tremor, handwriting is legible, not particularly tremulous,  not particularly micrographic. Romberg is negative. Reflexes are 2 to 3+ throughout. Babinski: Toes are flexor bilaterally. Fine motor skills and coordination: intact with normal finger taps, normal hand movements, normal rapid alternating patting, normal foot taps and normal foot agility.  Cerebellar testing: No dysmetria or intention tremor. There is no truncal or gait ataxia.  Sensory exam: intact to light touch in the upper and lower extremities.  Gait, station and balance: She stands easily. She has a mildly stooped for age posture, she has decreased arm swing bilaterally to a mild degree, she has a little bit of trembling when she walks in the right hand only.  Assessment and plan:   In summary, Audrea MuscatJane Blanchet is a very pleasant 73 y.o.-year old female  with an underlying medical history of hyperlipidemia, vitamin D deficiency, asthma, and allergic rhinitis, who Presents for evaluation of her hand tremor as well as more recent onset of recurrent headaches.  Her situation is complicated secondary to significant anxiety and she also has other complaints including poor sleep, dizziness, significant sensitivities and side effects to medications which further complicates the picture.She has been seeing Dr. Hyacinth MeekerMiller in neurology out of [place] recently in the past 6 months, has tried and failed propranolol and Mysoline as well as gabapentin for tremor control. 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  I would like to suggest a brain MRI with and without contrast to rule out any structural  cause for her new onset headaches.  She is not ready to pursue this, reports that she would not be able to tolerate it due to anxiety flareup.  I offered her a small prescription for Xanax for this but she did not do well on lorazepam recently.In addition, I would like to rule out obstructive sleep apnea as a cause for morning headaches and recurrent headaches, plus her blood pressure was elevated today.  In addition, she does report sleeping poorly.  Nevertheless, she is currently not ready to pursue a sleep study. I did not suggest any new medications quite yet for her in light of significant side effects reported recently on several medications, of note, propranolol and gabapentin are often utilized for headache prevention as well.  Her headache description is not in keeping with classic migraines.  She may very well have tension headaches and is encouraged to talk to you about stress and anxiety management again. I asked her to pursue a special brain scan which is a nuclear medicine SPECT scan called DaTscan.  I would like to order this and she is agreeable.  It may help narrow down her tremor diagnosis. She and her husband are advised that this is not a definitive test or diagnostic test for Parkinson's disease per se.We talked about the importance of good nutrition and good hydration with water.  She is currently not hydrating very well with water.  She would be willing to increase her water intake, Which may help her headaches, her tremors and also her dizziness. As far as further diagnostic testing is concerned, I suggested the following today: We will proceed with a DaTscan.  I would like for her to think about coming in for sleep study and to pursue the brain MRI with and without contrast. She is advised to call our office should she change her mind about these.  We will keep her  posted as to her DaTscan results.  As far as medications are concerned, I recommended the following at this time: no change.   I answered all her questions today and the patient and her husband were in agreement with the above outlined plan. I would like to see the patient back in 3 months, sooner if the need arises and encouraged them to call with any interim questions, concerns, problems or updates.    Most of my 60 minute visit today was spent in counseling and coordination of care, reviewing test results and reviewing medication.  Thank you very much for allowing me to participate in the care of this nice patient. If I can be of any further assistance to you please do not hesitate to call me at [phone].  Sincerely,   Huston FoleySaima Bassam Dresch, MD, PhD

## 2019-04-03 NOTE — Telephone Encounter (Signed)
Noted  

## 2019-05-09 ENCOUNTER — Ambulatory Visit: Payer: Medicare Other | Admitting: Adult Health

## 2019-05-09 ENCOUNTER — Encounter: Payer: Self-pay | Admitting: Adult Health

## 2019-05-09 ENCOUNTER — Ambulatory Visit (INDEPENDENT_AMBULATORY_CARE_PROVIDER_SITE_OTHER): Payer: Medicare Other | Admitting: Adult Health

## 2019-05-09 ENCOUNTER — Other Ambulatory Visit: Payer: Self-pay

## 2019-05-09 DIAGNOSIS — F411 Generalized anxiety disorder: Secondary | ICD-10-CM | POA: Diagnosis not present

## 2019-05-09 MED ORDER — LORAZEPAM 0.5 MG PO TABS: 0.5000 mg | ORAL_TABLET | Freq: Two times a day (BID) | ORAL | 0 refills | Status: DC

## 2019-05-09 MED ORDER — LORAZEPAM 0.5 MG PO TABS: 0.5000 mg | ORAL_TABLET | Freq: Two times a day (BID) | ORAL | 2 refills | Status: DC

## 2019-05-09 NOTE — Progress Notes (Signed)
[facility] MD/PA/NP Initial Note  05/09/2019 9:27 AM Briana MuscatJane Lee  MRN:  [id number]  Chief Complaint:  Chief Complaint    Anxiety; Depression; Insomnia      HPI: Anxiety, depression, insomnia  Describes mood today as "very nervous". Pleasant. Anxious. Mood symptoms - some depression, anxiety, and irritability. More "anxious" overall. Has difficulties "settling" down. Stating "I have been feeling better over the past few days". Feels like current emotional state related to a culmination of events over the past several months. Has dealt with back to back health issues. Has been unable to return to her regular routines. Has been seen by a [facility] at [facility]. She was prescribed Zoloft 50mg , remeron 7.5mg , and Ativan 0.5mg  BID. She has had GI issues, headaches, and nausea since starting. Increased reflux. Has been trying to continue and increase doses of these medications, but hasn't felt like she could until recently. She has  increased Remeron from 1/2 tab to 3/4 tabs at bedtime is now sleeping "better". Has started taking Ativan 1/4 tab - took 3 pieces yesterday and feels it has helped. Has continued to take Zoloft at 25mg  daily. Has not been able to increase due to side effects. Is willing to increase to 37.5mg  starting today. Does not want to change to another SSRI. Varying interest and motivation. Taking medications, but not as prescribed.  Energy levels have been lower. Active, does not currently have a regular exercise routine. Usually enjoys playing tennis.  Enjoys some usual interests and activities. Spending time with family. Talking to friends. Appetite adequate. Weight stable. Sleeping better over past few nights. Averages 6 to 8 hours. Focus and concentration stable. Completing tasks. Managing aspects of household.  Denies SI or HI. Denies AH or VH.  Visit Diagnosis:    ICD-10-CM   1. Generalized anxiety disorder  F41.1 LORazepam (ATIVAN) 0.5 MG tablet    DISCONTINUED: LORazepam  (ATIVAN) 0.5 MG tablet    Past Psychiatric History: Previous history of depression.  Past Medical History:  Past Medical History:  Diagnosis Date  . Asthma   . Episodic tension-type headache, not intractable   . Essential tremor   . High cholesterol   . Hyperlipemia   . Malaise   . Vitamin D deficiency     Past Surgical History:  Procedure Laterality Date  . TONSILLECTOMY      Family Psychiatric History: Denies.  Family History: No family history on file.  Social History:  Social History   Socioeconomic History  . Marital status: Married    Spouse name: Not on file  . Number of children: Not on file  . Years of education: Not on file  . Highest education level: Not on file  Occupational History  . Not on file  Social Needs  . Financial resource strain: Not on file  . Food insecurity    Worry: Not on file    Inability: Not on file  . Transportation needs    Medical: Not on file    Non-medical: Not on file  Tobacco Use  . Smoking status: Never Smoker  . Smokeless tobacco: Never Used  Substance and Sexual Activity  . Alcohol use: Never    Frequency: Never  . Drug use: Never  . Sexual activity: Not on file  Lifestyle  . Physical activity    Days per week: Not on file    Minutes per session: Not on file  . Stress: Not on file  Relationships  . Social Musicianconnections    Talks on  phone: Not on file    Gets together: Not on file    Attends religious service: Not on file    Active member of club or organization: Not on file    Attends meetings of clubs or organizations: Not on file    Relationship status: Not on file  Other Topics Concern  . Not on file  Social History Narrative  . Not on file    Allergies:  Allergies  Allergen Reactions  . Codeine Nausea And Vomiting  . Penicillins     unknown  . Sulfa Antibiotics     rash    Metabolic Disorder Labs: No results found for: HGBA1C, MPG No results found for: PROLACTIN No results found for: CHOL,  TRIG, HDL, CHOLHDL, VLDL, LDLCALC No results found for: TSH  Therapeutic Level Labs: No results found for: LITHIUM No results found for: VALPROATE No components found for:  CBMZ  Current Medications: Current Outpatient Medications  Medication Sig Dispense Refill  . albuterol (PROVENTIL HFA;VENTOLIN HFA) 108 (90 Base) MCG/ACT inhaler INHALE 1 TO 2 PUFFS BY MOUTH EVERY 4 TO 6 HOURS AS NEEDED    . atorvastatin (LIPITOR) 10 MG tablet Take 10 mg by mouth daily.    . candesartan (ATACAND) 4 MG tablet     . LORazepam (ATIVAN) 0.5 MG tablet Take 1 tablet (0.5 mg total) by mouth 2 (two) times daily. 60 tablet 2  . mirtazapine (REMERON) 7.5 MG tablet     . omeprazole (PRILOSEC) 20 MG capsule Take by mouth daily.    . rosuvastatin (CRESTOR) 10 MG tablet Take 10 mg by mouth daily.    . sertraline (ZOLOFT) 50 MG tablet     . Triamcinolone Acetonide (NASACORT AQ NA) Place into the nose.    . Vitamin D, Ergocalciferol, (DRISDOL) 1.25 MG (50000 UT) CAPS capsule TK 1 C PO 1 TIME A WK     No current facility-administered medications for this visit.     Medication Side Effects: none  Orders placed this visit:  No orders of the defined types were placed in this encounter.   Psychiatric Specialty Exam:  ROS  There were no vitals taken for this visit.There is no height or weight on file to calculate BMI.  General Appearance: Neat and Well Groomed  Eye Contact:  Good  Speech:  Normal Rate  Volume:  Normal  Mood:  Anxious  Affect:  Congruent  Thought Process:  Coherent  Orientation:  Full (Time, Place, and Person)  Thought Content: Logical   Suicidal Thoughts:  No  Homicidal Thoughts:  No  Memory:  WNL  Judgement:  Good  Insight:  Good  Psychomotor Activity:  Normal  Concentration:  Concentration: Good  Recall:  NA  Fund of Knowledge: Good  Language: Good  Assets:  Communication Skills Desire for Improvement Financial Resources/Insurance Housing Intimacy Leisure Time Physical  Health Resilience Social Support Talents/Skills Transportation Vocational/Educational  ADL's:  Intact  Cognition: WNL  Prognosis:  Good   Screenings:   Receiving Psychotherapy: No   Treatment Plan/Recommendations:   Plan:  1. Ativan .5mg  daily 2. Zoloft 25mg  daily 3. Remeron 7.5mg  at hs   RTC 4 weeks  Patient advised to contact office with any questions, adverse effects, or acute worsening in signs and symptoms.  Discussed potential benefits, risk, and side effects of benzodiazepines to include potential risk of tolerance and dependence, as well as possible drowsiness.  Advised patient not to drive if experiencing drowsiness and to take lowest possible effective dose  to minimize risk of dependence and tolerance.  Dorothyann Gibbsegina N Traveon Louro, NP

## 2019-05-30 ENCOUNTER — Encounter: Payer: Self-pay | Admitting: Adult Health

## 2019-05-30 ENCOUNTER — Ambulatory Visit (INDEPENDENT_AMBULATORY_CARE_PROVIDER_SITE_OTHER): Payer: Medicare Other | Admitting: Adult Health

## 2019-05-30 ENCOUNTER — Other Ambulatory Visit: Payer: Self-pay

## 2019-05-30 VITALS — Wt 132.0 lb

## 2019-05-30 DIAGNOSIS — F411 Generalized anxiety disorder: Secondary | ICD-10-CM | POA: Diagnosis not present

## 2019-05-30 DIAGNOSIS — G4700 Insomnia, unspecified: Secondary | ICD-10-CM | POA: Diagnosis not present

## 2019-05-30 MED ORDER — MIRTAZAPINE 15 MG PO TABS: 15.0000 mg | ORAL_TABLET | Freq: Every day | ORAL | 2 refills | Status: DC

## 2019-05-30 NOTE — Progress Notes (Signed)
Briana Lee [id number] 01-Nov-1945 73 y.o.  Virtual Visit via Telephone Note  I connected with pt on 05/30/19 at 10:30 AM EDT by telephone and verified that I am speaking with the correct person using two identifiers.   I discussed the limitations, risks, security and privacy concerns of performing an evaluation and management service by telephone and the availability of in person appointments. I also discussed with the patient that there may be a patient responsible charge related to this service. The patient expressed understanding and agreed to proceed.   I discussed the assessment and treatment plan with the patient. The patient was provided an opportunity to ask questions and all were answered. The patient agreed with the plan and demonstrated an understanding of the instructions.   The patient was advised to call back or seek an in-person evaluation if the symptoms worsen or if the condition fails to improve as anticipated.  I provided 30 minutes of non-face-to-face time during this encounter.  The patient was located at home.  The provider was located at [place].   Aloha Gell, NP   Subjective:   Patient ID:  Briana Lee is a 73 y.o. (DOB [birth year]) female.  Chief Complaint: No chief complaint on file.   HPI Briana Lee presents for follow-up of depression, anxiety, and insomna.  HPI: Anxiety, depression, insomnia  Describes mood today as "not as bad as it was". Pleasant. Mood symptoms - denies depression. Decrease anxiety and irritability. Stating "my anxiety is not as bad as it was". Feeling kind of "flat and emotionless". Notes having two incidents of "bruxism". Notes it to be painful and having to use compresses to ease symptoms. Is taking 4 quarter tabs of Ativan a day. Feels like it keeps her calm - only taking when "anxious". Concerned about continuing Zoloft due to the side effects. Is willing to taper off and escalate the Remeron. Varying interest and  motivation. Friends calling and wanting to do things, but she doesn't feel like she is ready to return to usual interests and activities even though she is doing better overall. Taking medications, but not as prescribed.  Energy levels better. Active, does not currently have a regular exercise routine. Enjoys some usual interests and activities. Married - lives with husband. Spending time with family. Talking to friends. Went to [employer] and had lunch and did some shopping.  Appetite adequate. Weight stable - 132 pounds. Sleeping well most nights. Averages 8 hours. Focus and concentration stable. Completing tasks. Managing aspects of household.  Denies SI or HI. Denies AH or VH.  Review of Systems:  Review of Systems  Musculoskeletal: Negative for gait problem.  Neurological: Negative for tremors and weakness.  Psychiatric/Behavioral: The patient is nervous/anxious.        Please refer to HPI    Medications: I have reviewed the patient's current medications.  Current Outpatient Medications  Medication Sig Dispense Refill  . albuterol (PROVENTIL HFA;VENTOLIN HFA) 108 (90 Base) MCG/ACT inhaler INHALE 1 TO 2 PUFFS BY MOUTH EVERY 4 TO 6 HOURS AS NEEDED    . atorvastatin (LIPITOR) 10 MG tablet Take 10 mg by mouth daily.    . candesartan (ATACAND) 4 MG tablet     . LORazepam (ATIVAN) 0.5 MG tablet Take 1 tablet (0.5 mg total) by mouth 2 (two) times daily. 60 tablet 2  . mirtazapine (REMERON) 15 MG tablet Take 1 tablet (15 mg total) by mouth at bedtime. 30 tablet 2  . omeprazole (PRILOSEC) 20 MG capsule Take  by mouth daily.    . rosuvastatin (CRESTOR) 10 MG tablet Take 10 mg by mouth daily.    . sertraline (ZOLOFT) 50 MG tablet     . Triamcinolone Acetonide (NASACORT AQ NA) Place into the nose.    . Vitamin D, Ergocalciferol, (DRISDOL) 1.25 MG (50000 UT) CAPS capsule TK 1 C PO 1 TIME A WK     No current facility-administered medications for this visit.     Medication Side Effects: Other:  Bruxism with Zoloft  Allergies:  Allergies  Allergen Reactions  . Codeine Nausea And Vomiting  . Penicillins     unknown  . Sulfa Antibiotics     rash    Past Medical History:  Diagnosis Date  . Asthma   . Episodic tension-type headache, not intractable   . Essential tremor   . High cholesterol   . Hyperlipemia   . Malaise   . Vitamin D deficiency     No family history on file.  Social History   Socioeconomic History  . Marital status: Married    Spouse name: Not on file  . Number of children: Not on file  . Years of education: Not on file  . Highest education level: Not on file  Occupational History  . Not on file  Social Needs  . Financial resource strain: Not on file  . Food insecurity    Worry: Not on file    Inability: Not on file  . Transportation needs    Medical: Not on file    Non-medical: Not on file  Tobacco Use  . Smoking status: Never Smoker  . Smokeless tobacco: Never Used  Substance and Sexual Activity  . Alcohol use: Never    Frequency: Never  . Drug use: Never  . Sexual activity: Not on file  Lifestyle  . Physical activity    Days per week: Not on file    Minutes per session: Not on file  . Stress: Not on file  Relationships  . Social Musician on phone: Not on file    Gets together: Not on file    Attends religious service: Not on file    Active member of club or organization: Not on file    Attends meetings of clubs or organizations: Not on file    Relationship status: Not on file  . Intimate partner violence    Fear of current or ex partner: Not on file    Emotionally abused: Not on file    Physically abused: Not on file    Forced sexual activity: Not on file  Other Topics Concern  . Not on file  Social History Narrative  . Not on file    Past Medical History, Surgical history, Social history, and Family history were reviewed and updated as appropriate.   Please see review of systems for further details on the  patient's review from today.   Objective:   Physical Exam:  There were no vitals taken for this visit.  Physical Exam  Lab Review:     Component Value Date/Time   NA 141 03/23/2019 0901   K 3.7 03/23/2019 0901   CL 104 03/23/2019 0901   CO2 26 03/23/2019 0901   GLUCOSE 102 (H) 03/23/2019 0901   BUN 9 03/23/2019 0901   CREATININE 0.69 03/23/2019 0901   CALCIUM 9.2 03/23/2019 0901   PROT 7.5 03/23/2019 0901   ALBUMIN 3.9 03/23/2019 0901   AST 20 03/23/2019 0901   ALT 14  03/23/2019 0901   ALKPHOS 61 03/23/2019 0901   BILITOT 1.3 (H) 03/23/2019 0901   GFRNONAA >60 03/23/2019 0901   GFRAA >60 03/23/2019 0901       Component Value Date/Time   WBC 5.8 03/23/2019 0901   RBC 4.94 03/23/2019 0901   HGB 14.6 03/23/2019 0901   HCT 45.7 03/23/2019 0901   PLT 190 03/23/2019 0901   MCV 92.5 03/23/2019 0901   MCH 29.6 03/23/2019 0901   MCHC 31.9 03/23/2019 0901   RDW 12.7 03/23/2019 0901    No results found for: POCLITH, LITHIUM   No results found for: PHENYTOIN, PHENOBARB, VALPROATE, CBMZ   .res Assessment: Plan:    Plan:  1. Ativan 0.5mg  daily - takes 4 quarters a day - as refills 2. Zoloft 25mg  daily x 5, then leave off. Patient reports 2 incidents of "bruxism". Will stop Zoloft and avoid SSRI's  3. Increase Remeron 7.5 to 15mg  at hs - take one and 1/2 tabs daily x 7 days, then one tablet daily - sending in 15mg  tablet.   RTC 4 weeks  Diagnoses and all orders for this visit:  Generalized anxiety disorder -     mirtazapine (REMERON) 15 MG tablet; Take 1 tablet (15 mg total) by mouth at bedtime.  Insomnia, unspecified type -     mirtazapine (REMERON) 15 MG tablet; Take 1 tablet (15 mg total) by mouth at bedtime.    Please see After Visit Summary for patient specific instructions.  Future Appointments  Date Time Provider Department Center  07/04/2019 11:30 AM Huston FoleyAthar, Saima, MD [facility] None    No orders of the defined types were placed in this encounter.      -------------------------------

## 2019-06-21 ENCOUNTER — Other Ambulatory Visit: Payer: Self-pay | Admitting: Adult Health

## 2019-06-21 DIAGNOSIS — G4700 Insomnia, unspecified: Secondary | ICD-10-CM

## 2019-06-21 DIAGNOSIS — F411 Generalized anxiety disorder: Secondary | ICD-10-CM

## 2019-07-04 ENCOUNTER — Ambulatory Visit: Payer: Medicare Other | Admitting: Neurology

## 2019-09-23 ENCOUNTER — Other Ambulatory Visit: Payer: Self-pay | Admitting: Adult Health

## 2019-09-23 DIAGNOSIS — F411 Generalized anxiety disorder: Secondary | ICD-10-CM

## 2019-09-23 DIAGNOSIS — G4700 Insomnia, unspecified: Secondary | ICD-10-CM

## 2019-10-11 ENCOUNTER — Ambulatory Visit: Payer: Medicare PPO | Attending: Internal Medicine

## 2019-10-11 DIAGNOSIS — Z23 Encounter for immunization: Secondary | ICD-10-CM

## 2019-10-11 NOTE — Progress Notes (Signed)
   Covid-19 Vaccination Clinic  Name:  Sheridyn Canino    MRN: [id number] DOB: 1946/06/17  10/11/2019  Ms. Celli was observed post Covid-19 immunization for 15 minutes without incidence. She was provided with Vaccine Information Sheet and instruction to access the V-Safe system.   Ms. Berland was instructed to call 911 with any severe reactions post vaccine: Marland Kitchen Difficulty breathing  . Swelling of your face and throat  . A fast heartbeat  . A bad rash all over your body  . Dizziness and weakness    Immunizations Administered    Name Date Dose VIS Date Route   Pfizer COVID-19 Vaccine 10/11/2019 10:54 AM 0.3 mL 08/30/2019 Intramuscular   Manufacturer: [employer], [employer]   Lot: [id number]   NDC: [address]-3

## 2019-11-01 ENCOUNTER — Ambulatory Visit: Payer: Medicare PPO | Attending: Internal Medicine

## 2019-11-01 DIAGNOSIS — Z23 Encounter for immunization: Secondary | ICD-10-CM | POA: Insufficient documentation

## 2019-11-01 NOTE — Progress Notes (Signed)
   Covid-19 Vaccination Clinic  Name:  Briana Lee    MRN: [id number] DOB: [birth year]  11/01/2019  [name] was observed post Covid-19 immunization for 15 minutes without incidence. She was provided with Vaccine Information Sheet and instruction to access the V-Safe system.   [name] was instructed to call 911 with any severe reactions post vaccine: Marland Kitchen Difficulty breathing  . Swelling of your face and throat  . A fast heartbeat  . A bad rash all over your body  . Dizziness and weakness    Immunizations Administered    Name Date Dose VIS Date Route   Pfizer COVID-19 Vaccine 11/01/2019 10:51 AM 0.3 mL 08/30/2019 Intramuscular   Manufacturer: [employer], [employer]   Lot: [id number]   NDC: [address]-4

## 2020-07-06 IMAGING — DX DG LUMBAR SPINE COMPLETE 4+V
5 series · 5 of 5 positions shown · non-contrast
Comparison: None.

CLINICAL DATA: Pain after fall

EXAM:
LUMBAR SPINE - COMPLETE 4+ VIEW

[l-spine ap]
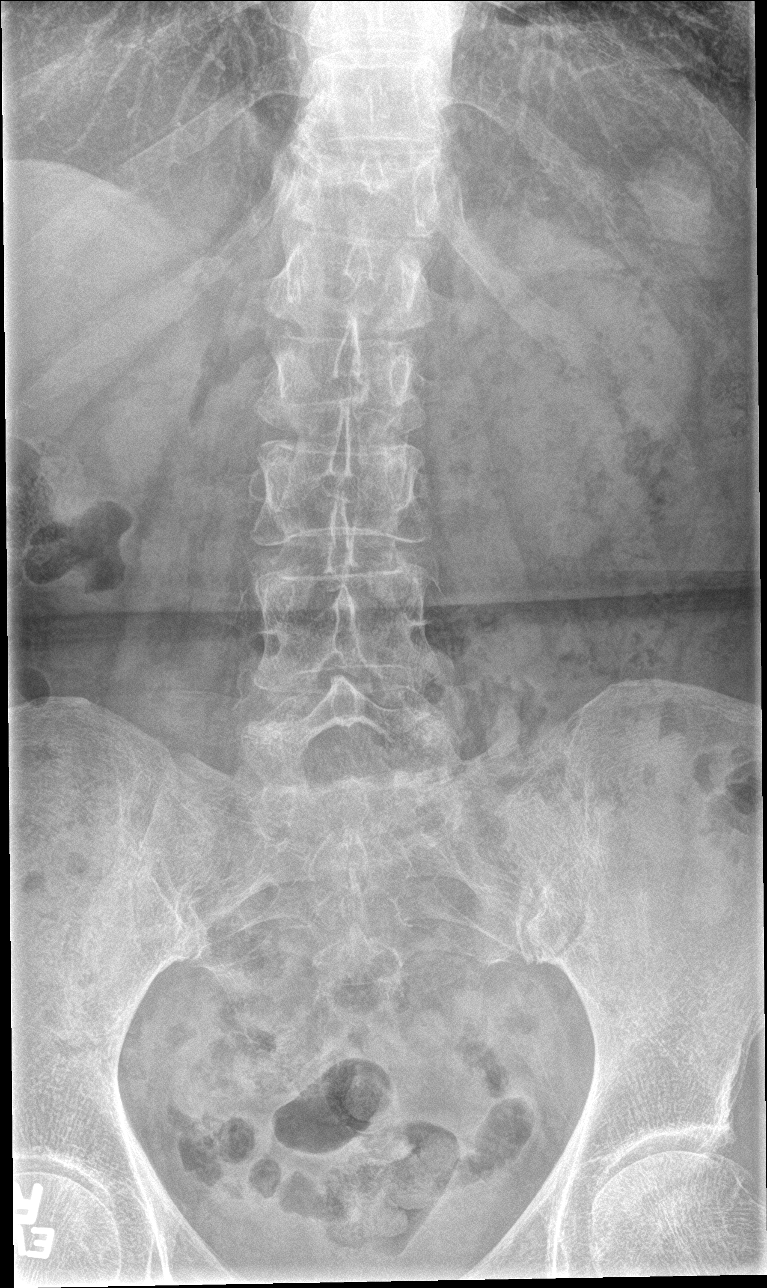

[l-spine obl (1 of 2)]
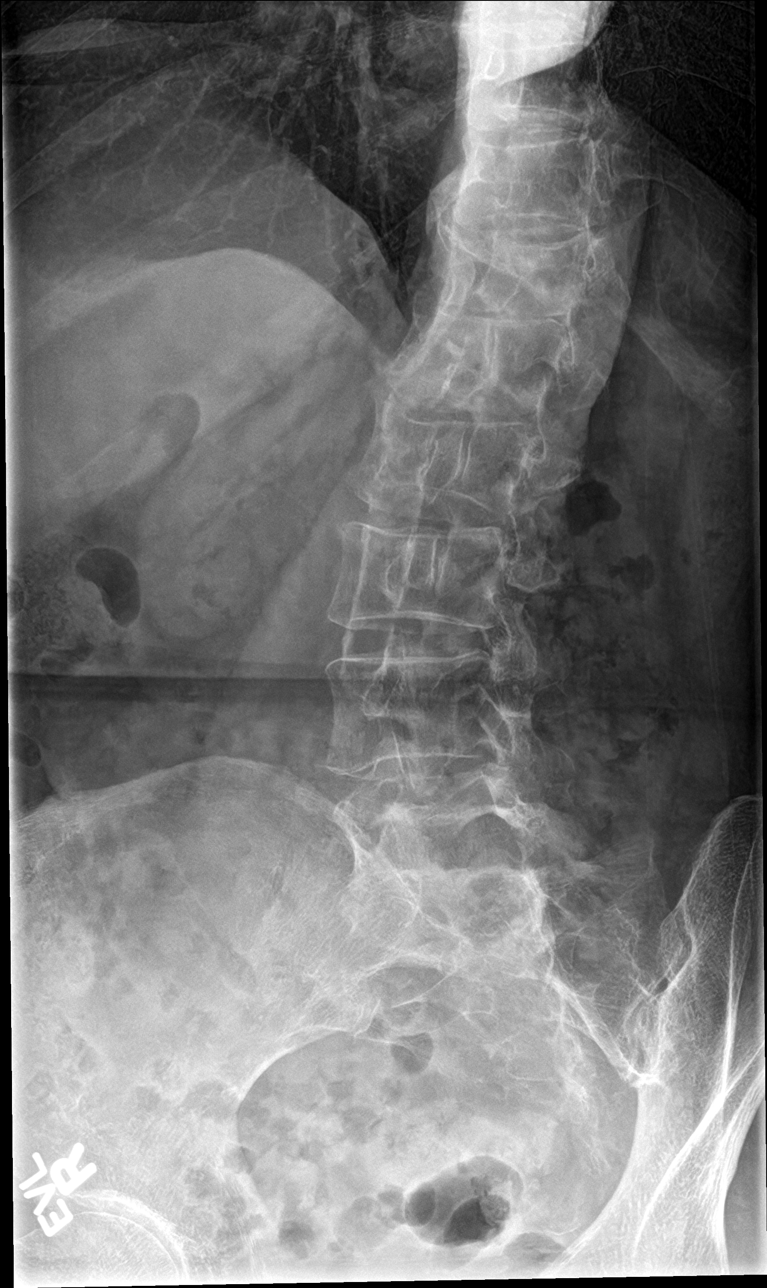

[l-spine obl (2 of 2)]
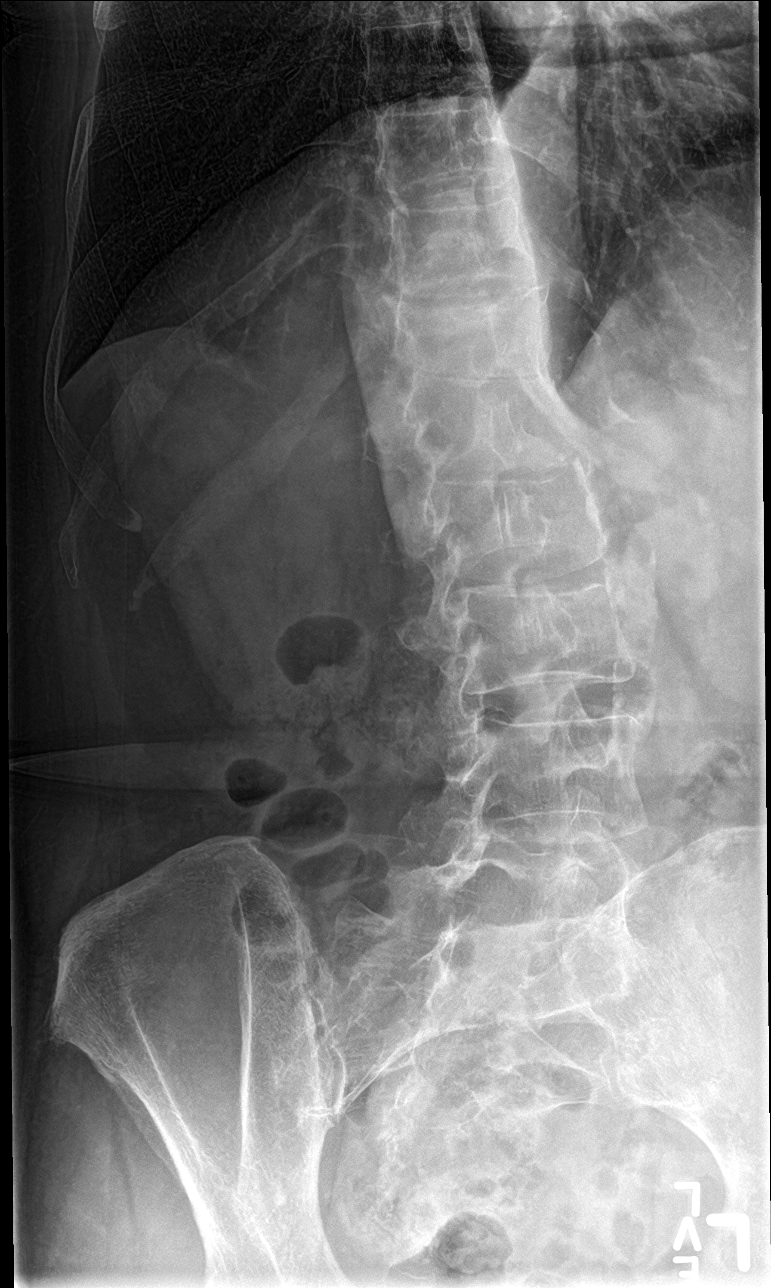

[l-spine lat]
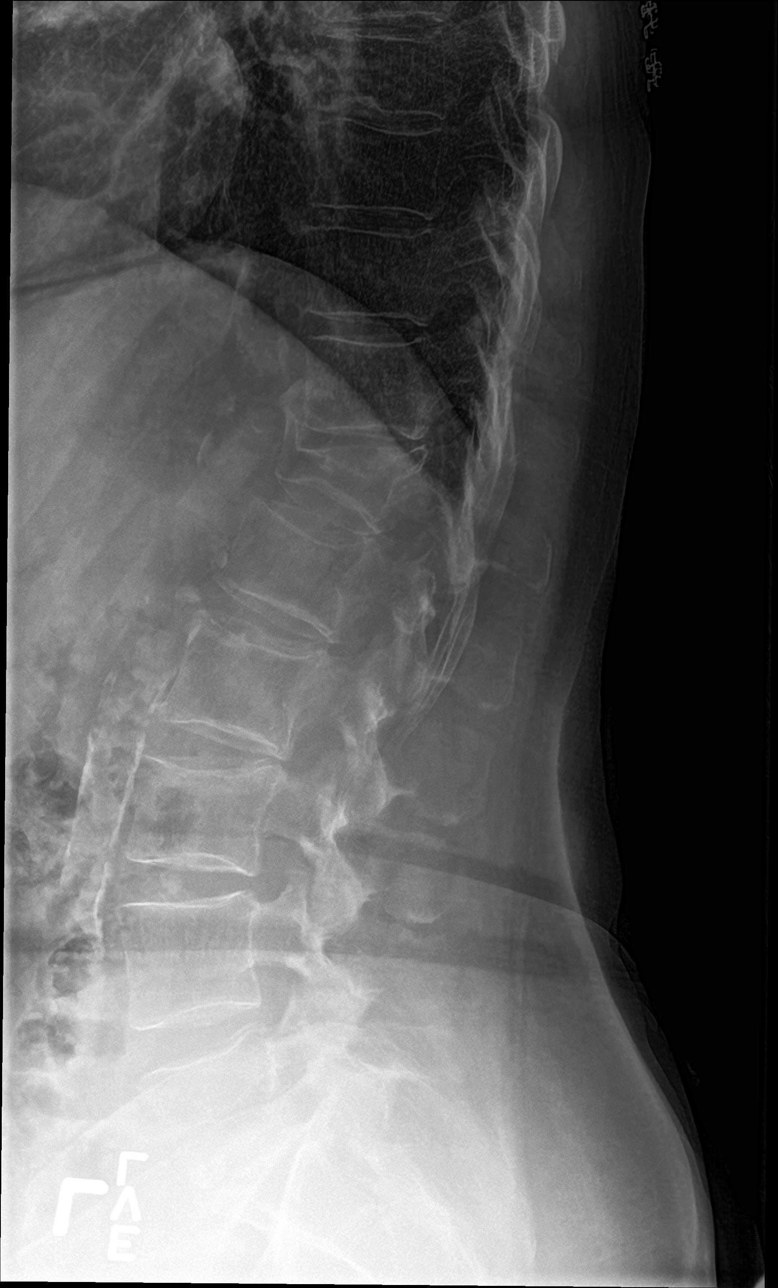

[l-spine spot]
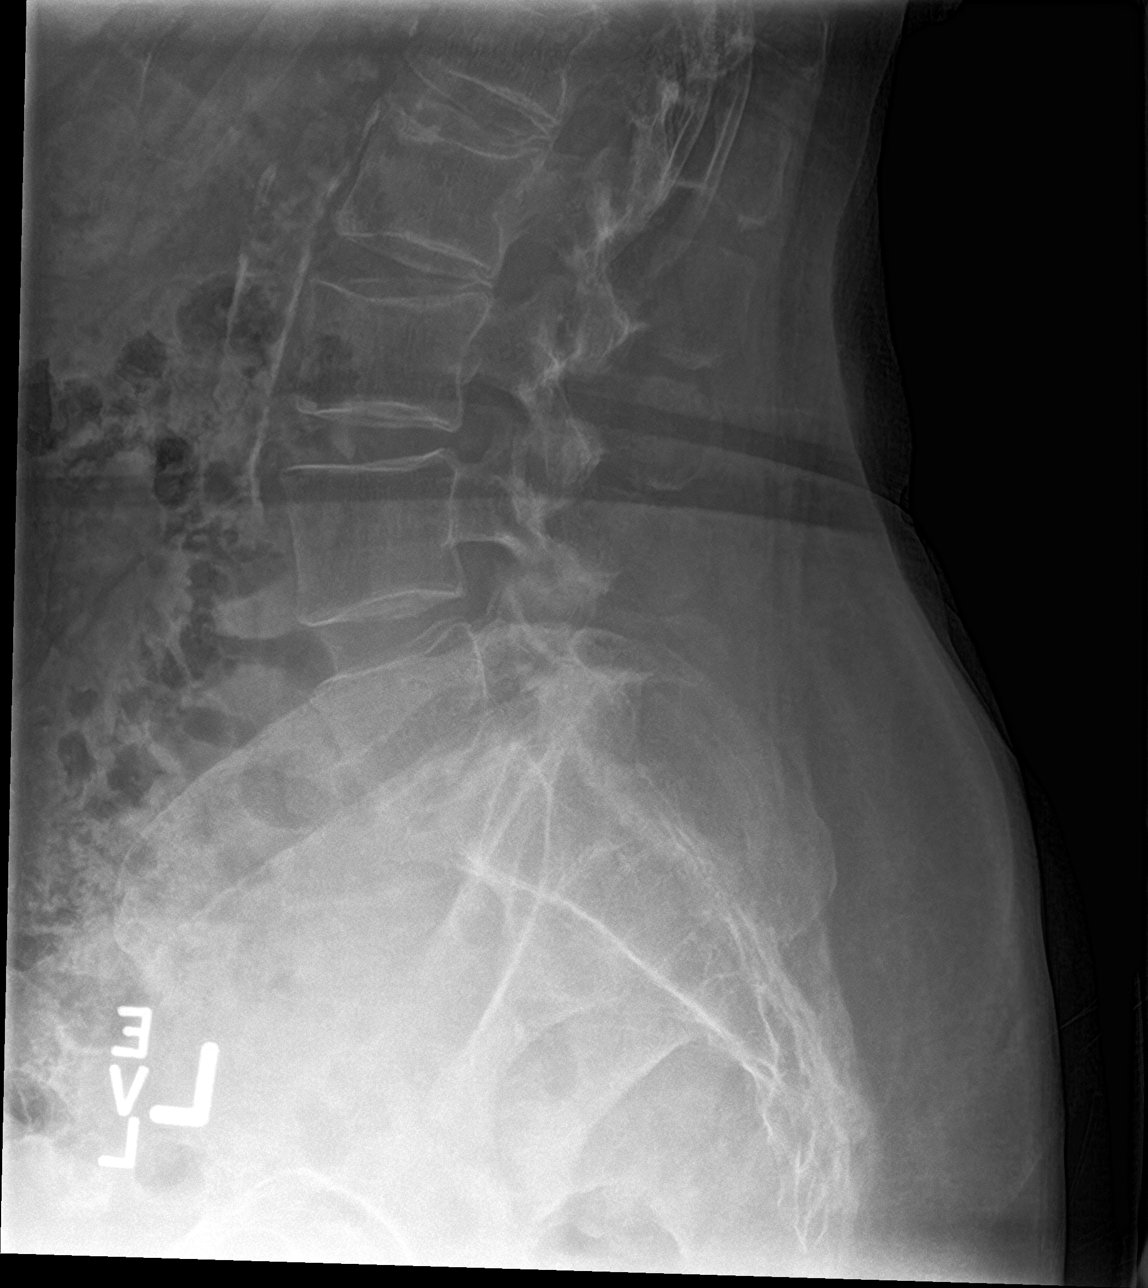

[5 of 5 positions shown; findings below may reference images not displayed]

FINDINGS: Five non rib-bearing lumbar type vertebra. Moderate to marked
degenerative change L5-S1. Suspected acute superior endplate
fracture at L3. Age indeterminate moderate to marked compression
fracture T12 with close to 50% loss of height anteriorly. Mild
kyphosis at this level. 3 mm retropulsion.
IMPRESSION: 1. Age indeterminate moderate to marked compression fracture T12
with 3 mm retropulsion
2. Suspected acute superior endplate fracture at L3

## 2021-04-20 ENCOUNTER — Encounter (HOSPITAL_BASED_OUTPATIENT_CLINIC_OR_DEPARTMENT_OTHER): Payer: Self-pay

## 2021-04-20 ENCOUNTER — Emergency Department (HOSPITAL_BASED_OUTPATIENT_CLINIC_OR_DEPARTMENT_OTHER): Payer: Medicare PPO

## 2021-04-20 ENCOUNTER — Other Ambulatory Visit: Payer: Self-pay

## 2021-04-20 ENCOUNTER — Observation Stay (HOSPITAL_BASED_OUTPATIENT_CLINIC_OR_DEPARTMENT_OTHER)
Admission: EM | Admit: 2021-04-20 | Discharge: 2021-04-24 | Disposition: A | Discharge status: Home / Self Care | Payer: Medicare PPO | Attending: Internal Medicine | Admitting: Internal Medicine

## 2021-04-20 DIAGNOSIS — Z79899 Other long term (current) drug therapy: Secondary | ICD-10-CM | POA: Insufficient documentation

## 2021-04-20 DIAGNOSIS — K6289 Other specified diseases of anus and rectum: Secondary | ICD-10-CM | POA: Diagnosis present

## 2021-04-20 DIAGNOSIS — R1084 Generalized abdominal pain: Secondary | ICD-10-CM

## 2021-04-20 DIAGNOSIS — K529 Noninfective gastroenteritis and colitis, unspecified: Principal | ICD-10-CM | POA: Insufficient documentation

## 2021-04-20 DIAGNOSIS — J45909 Unspecified asthma, uncomplicated: Secondary | ICD-10-CM | POA: Diagnosis not present

## 2021-04-20 DIAGNOSIS — I10 Essential (primary) hypertension: Secondary | ICD-10-CM | POA: Insufficient documentation

## 2021-04-20 DIAGNOSIS — Z20822 Contact with and (suspected) exposure to covid-19: Secondary | ICD-10-CM | POA: Insufficient documentation

## 2021-04-20 LAB — CBC WITH DIFFERENTIAL/PLATELET
Abs Immature Granulocytes: 0.06 10*3/uL (ref 0.00–0.07)
Basophils Absolute: 0 10*3/uL (ref 0.0–0.1)
Basophils Relative: 0 %
Eosinophils Absolute: 0 10*3/uL (ref 0.0–0.5)
Eosinophils Relative: 0 %
HCT: 40.4 % (ref 36.0–46.0)
Hemoglobin: 13.2 g/dL (ref 12.0–15.0)
Immature Granulocytes: 1 %
Lymphocytes Relative: 7 %
Lymphs Abs: 0.8 10*3/uL (ref 0.7–4.0)
MCH: 29.2 pg (ref 26.0–34.0)
MCHC: 32.7 g/dL (ref 30.0–36.0)
MCV: 89.4 fL (ref 80.0–100.0)
Monocytes Absolute: 0.8 10*3/uL (ref 0.1–1.0)
Monocytes Relative: 7 %
Neutro Abs: 9.7 10*3/uL — ABNORMAL HIGH (ref 1.7–7.7)
Neutrophils Relative %: 85 %
Platelets: 196 10*3/uL (ref 150–400)
RBC: 4.52 MIL/uL (ref 3.87–5.11)
RDW: 12.9 % (ref 11.5–15.5)
WBC: 11.4 10*3/uL — ABNORMAL HIGH (ref 4.0–10.5)
nRBC: 0 % (ref 0.0–0.2)

## 2021-04-20 LAB — LACTIC ACID, PLASMA
Lactic Acid, Venous: 2.7 mmol/L (ref 0.5–1.9)
Lactic Acid, Venous: 1.7 mmol/L (ref 0.5–1.9)

## 2021-04-20 LAB — URINALYSIS, ROUTINE W REFLEX MICROSCOPIC
Bilirubin Urine: NEGATIVE
Glucose, UA: NEGATIVE mg/dL
Ketones, ur: 15 mg/dL — AB
Leukocytes,Ua: NEGATIVE
Nitrite: NEGATIVE
Protein, ur: NEGATIVE mg/dL
Specific Gravity, Urine: 1.01 (ref 1.005–1.030)
pH: 6 (ref 5.0–8.0)

## 2021-04-20 LAB — COMPREHENSIVE METABOLIC PANEL
ALT: 12 U/L (ref 0–44)
AST: 20 U/L (ref 15–41)
Albumin: 3.5 g/dL (ref 3.5–5.0)
Alkaline Phosphatase: 55 U/L (ref 38–126)
Anion gap: 10 (ref 5–15)
BUN: 7 mg/dL — ABNORMAL LOW (ref 8–23)
CO2: 26 mmol/L (ref 22–32)
Calcium: 9 mg/dL (ref 8.9–10.3)
Chloride: 101 mmol/L (ref 98–111)
Creatinine, Ser: 0.8 mg/dL (ref 0.44–1.00)
GFR, Estimated: >60 mL/min (ref >60)
Glucose, Bld: 114 mg/dL — ABNORMAL HIGH (ref 70–99)
Potassium: 3.6 mmol/L (ref 3.5–5.1)
Sodium: 137 mmol/L (ref 135–145)
Total Bilirubin: 0.8 mg/dL (ref 0.3–1.2)
Total Protein: 7.3 g/dL (ref 6.5–8.1)

## 2021-04-20 LAB — URINALYSIS, MICROSCOPIC (REFLEX)

## 2021-04-20 LAB — LIPASE, BLOOD: Lipase: 24 U/L (ref 11–51)

## 2021-04-20 MED ORDER — SODIUM CHLORIDE 0.9 % IV BOLUS
500.0000 mL | Freq: Once | INTRAVENOUS | Status: AC
  Administered 2021-04-20: 500 mL via INTRAVENOUS

## 2021-04-20 MED ORDER — ACETAMINOPHEN 500 MG PO TABS
1000.0000 mg | ORAL_TABLET | Freq: Once | ORAL | Status: AC
  Administered 2021-04-20: 1000 mg via ORAL
  Filled 2021-04-20: qty 2

## 2021-04-20 MED ORDER — ACETAMINOPHEN 500 MG PO TABS
1000.0000 mg | ORAL_TABLET | Freq: Once | ORAL | Status: DC
  Filled 2021-04-20: qty 2

## 2021-04-20 MED ORDER — ACETAMINOPHEN 500 MG PO TABS: 1000.0000 mg | ORAL_TABLET | Freq: Four times a day (QID) | ORAL | Status: DC | PRN

## 2021-04-20 NOTE — ED Triage Notes (Signed)
Pt c/o constipation since Saturday. States has the urge to have BM but nothing comes out. Has taken 2 suppositories and fleet enema with a "ball of stool" as a result. Continues to have urge and abdominal cramping.

## 2021-04-20 NOTE — ED Provider Notes (Signed)
[facility] EMERGENCY DEPARTMENT Provider Note   CSN: [id number] Arrival date & time: 04/20/21  1045     History Chief Complaint  Patient presents with   Constipation    Briana Lee is a 75 y.o. female.  HPI 75 year old female with a past medical history documented below who presents to the emergency department today with husband at bedside for evaluation of rectal pain.  Patient reports that she has been constipated for the past several days.  Patient did try a Fleet enema this morning and had a small amount of stool return.  Patient reports he has the urge to use the restroom but nothing will come out.  She denies any significant abdominal pain.  No urinary symptoms.  She reports some low-grade fevers last night.  Denies any chills.  No bloody stools.  Denies any history of any chronic constipation or pain medication.  No history of same.     Past Medical History:  Diagnosis Date   Asthma    Episodic tension-type headache, not intractable    Essential tremor    High cholesterol    Hyperlipemia    Malaise    Vitamin D deficiency     Patient Active Problem List   Diagnosis Date Noted   New persistent daily headache 03/25/2019   Episodic tension-type headache, not intractable 03/07/2019   Vitamin D deficiency 07/24/2018   Malaise 07/24/2018   Tremor, essential 12/28/2017   Mild intermittent asthma without complication 02/23/2013   Perennial allergic rhinitis with seasonal variation 02/23/2013   Arthropathy, multiple sites 11/16/2011   Hyperlipidemia 11/16/2011    Past Surgical History:  Procedure Laterality Date   TONSILLECTOMY       OB History   No obstetric history on file.     History reviewed. No pertinent family history.  Social History   Tobacco Use   Smoking status: Never   Smokeless tobacco: Never  Substance Use Topics   Alcohol use: Never   Drug use: Never    Home Medications Prior to Admission medications   Medication Sig Start  Date End Date Taking? Authorizing Provider  albuterol (PROVENTIL HFA;VENTOLIN HFA) 108 (90 Base) MCG/ACT inhaler INHALE 1 TO 2 PUFFS BY MOUTH EVERY 4 TO 6 HOURS AS NEEDED 09/26/14   [provider]  atorvastatin (LIPITOR) 10 MG tablet Take 10 mg by mouth daily. 04/17/19   [provider]  candesartan (ATACAND) 4 MG tablet  05/03/19   [provider]  LORazepam (ATIVAN) 0.5 MG tablet Take 1 tablet (0.5 mg total) by mouth 2 (two) times daily. 05/09/19   Mozingo, Thereasa Solo, NP  mirtazapine (REMERON) 15 MG tablet TAKE 1 TABLET BY MOUTH EVERYDAY AT BEDTIME 09/23/19   Mozingo, Thereasa Solo, NP  omeprazole (PRILOSEC) 20 MG capsule Take by mouth daily. 04/24/19   [provider]  rosuvastatin (CRESTOR) 10 MG tablet Take 10 mg by mouth daily.    [provider]  sertraline (ZOLOFT) 50 MG tablet  04/25/19   [provider]  Triamcinolone Acetonide (NASACORT AQ NA) Place into the nose.    [provider]  Vitamin D, Ergocalciferol, (DRISDOL) 1.25 MG (50000 UT) CAPS capsule TK 1 C PO 1 TIME A WK 10/20/18   [provider]    Allergies    Codeine, Penicillins, and Sulfa antibiotics  Review of Systems   Review of Systems  Constitutional:  Positive for chills. Negative for fever.  HENT:  Negative for congestion.   Eyes:  Negative for  discharge.  Respiratory:  Negative for cough.   Gastrointestinal:  Positive for constipation and rectal pain. Negative for abdominal pain, blood in stool, diarrhea, nausea and vomiting.  Genitourinary:  Negative for difficulty urinating.  Skin:  Negative for color change.  Neurological:  Negative for headaches.  Psychiatric/Behavioral:  Negative for confusion.    Physical Exam Updated Vital Signs BP (!) 164/80 (BP Location: Left Arm)   Pulse (!) 105   Temp 98 F (36.7 C) (Oral)   Resp 18   Ht 5\' 6"  (1.676 m)   Wt 73.9 kg   SpO2 97%   BMI 26.31 kg/m   Physical Exam Vitals and nursing note  reviewed.  Constitutional:      General: She is not in acute distress.    Appearance: She is well-developed. She is not ill-appearing or toxic-appearing.  HENT:     Head: Normocephalic and atraumatic.  Eyes:     General: No scleral icterus.       Right eye: No discharge.        Left eye: No discharge.  Pulmonary:     Effort: No respiratory distress.  Abdominal:     General: Abdomen is flat. Bowel sounds are normal. There is no distension.     Palpations: Abdomen is soft. There is no mass.     Tenderness: There is no abdominal tenderness. There is no right CVA tenderness, left CVA tenderness, guarding or rebound.     Hernia: No hernia is present.  Musculoskeletal:        General: Normal range of motion.     Cervical back: Normal range of motion.  Skin:    General: Skin is warm and dry.     Coloration: Skin is not pale.  Neurological:     Mental Status: She is alert.  Psychiatric:        Behavior: Behavior normal.        Thought Content: Thought content normal.        Judgment: Judgment normal.    ED Results / Procedures / Treatments   Labs (all labs ordered are listed, but only abnormal results are displayed) Labs Reviewed - No data to display  EKG None  Radiology No results found.  Procedures Procedures   Medications Ordered in ED Medications - No data to display  ED Course  I have reviewed the triage vital signs and the nursing notes.  Pertinent labs & imaging results that were available during my care of the patient were reviewed by me and considered in my medical decision making (see chart for details).    MDM Rules/Calculators/A&P                           75 year old presents for concern for constipation.  She also ports rectal pain.  Had small amount of stool return after Fleet enema today.  Patient has no focal abdominal tenderness to palpation.  Vital signs are overall reassuring.  She is mildly hypertensive and tachycardic.  Patient otherwise is very  well-appearing.  Will obtain basic labs on the KUB.  Likely patient has impacted any disimpaction.  We will wait for imaging to return.  Rectal exam performed shows no stool in the rectal vault.  Patient tolerated procedure difficulties.  Patient CT imaging was performed that shows concern for inflammation with stranding around the distal transverse colon and the descending colon.  No signs of obstruction.  This could be signs of ischemic colitis  versus inflammatory colitis.  Patient's labs showed a mild leukocytosis of 11,000.  Lactic acid elevated at 2.7 and improved to one-point 5:07 100 cc of fluid.  Tachycardia improved as well.  Low sufficient this is infectious in nature given that patient has no diarrhea, fever, nausea or vomiting.  We will hold on antibiotics at this time.  Case was discussed with Dr. Elnoria Howard with GI who recommends admission and observation and possible colonoscopy.  Patient then decided she would like to try to be admitted to [facility] given that she has her GI providers at that facility.  Case was then discussed with [facility] provider Dr. Delton Prairie who again feels the patient would benefit from admission to the hospital for further observation and management.  Patient was then accepted for admission by Dr. Cyndia Bent to [facility].  Patient also has been admitted to [facility] awaiting for bed placement this time.  Patient has remained stable throughout her ER stay.  Vital signs remained reassuring.  No abdominal pain.  She has had a small amount of bowel movement while in the ER.  Patient signed out to PA-C Caccavle. Final Clinical Impression(s) / ED Diagnoses Final diagnoses:  Generalized abdominal pain    Rx / DC Orders ED Discharge Orders     None        Wallace Keller 04/20/21 Eda Keys, MD 04/22/21 [phone]

## 2021-04-21 DIAGNOSIS — K529 Noninfective gastroenteritis and colitis, unspecified: Secondary | ICD-10-CM

## 2021-04-21 DIAGNOSIS — K5900 Constipation, unspecified: Secondary | ICD-10-CM | POA: Diagnosis not present

## 2021-04-21 DIAGNOSIS — K6289 Other specified diseases of anus and rectum: Secondary | ICD-10-CM

## 2021-04-21 DIAGNOSIS — I10 Essential (primary) hypertension: Secondary | ICD-10-CM | POA: Diagnosis not present

## 2021-04-21 DIAGNOSIS — R933 Abnormal findings on diagnostic imaging of other parts of digestive tract: Secondary | ICD-10-CM | POA: Diagnosis not present

## 2021-04-21 DIAGNOSIS — J45909 Unspecified asthma, uncomplicated: Secondary | ICD-10-CM | POA: Diagnosis not present

## 2021-04-21 DIAGNOSIS — R1084 Generalized abdominal pain: Secondary | ICD-10-CM

## 2021-04-21 DIAGNOSIS — Z79899 Other long term (current) drug therapy: Secondary | ICD-10-CM | POA: Diagnosis not present

## 2021-04-21 DIAGNOSIS — Z20822 Contact with and (suspected) exposure to covid-19: Secondary | ICD-10-CM | POA: Diagnosis not present

## 2021-04-21 LAB — CBC
HCT: 36.9 % (ref 36.0–46.0)
Hemoglobin: 12.3 g/dL (ref 12.0–15.0)
MCH: 29.6 pg (ref 26.0–34.0)
MCHC: 33.3 g/dL (ref 30.0–36.0)
MCV: 88.7 fL (ref 80.0–100.0)
Platelets: 176 10*3/uL (ref 150–400)
RBC: 4.16 MIL/uL (ref 3.87–5.11)
RDW: 12.7 % (ref 11.5–15.5)
WBC: 11.8 10*3/uL — ABNORMAL HIGH (ref 4.0–10.5)
nRBC: 0 % (ref 0.0–0.2)

## 2021-04-21 LAB — BASIC METABOLIC PANEL
Anion gap: 12 (ref 5–15)
BUN: 10 mg/dL (ref 8–23)
CO2: 23 mmol/L (ref 22–32)
Calcium: 8.9 mg/dL (ref 8.9–10.3)
Chloride: 104 mmol/L (ref 98–111)
Creatinine, Ser: 0.77 mg/dL (ref 0.44–1.00)
GFR, Estimated: >60 mL/min (ref >60)
Glucose, Bld: 100 mg/dL — ABNORMAL HIGH (ref 70–99)
Potassium: 3.6 mmol/L (ref 3.5–5.1)
Sodium: 139 mmol/L (ref 135–145)

## 2021-04-21 LAB — MAGNESIUM: Magnesium: 2 mg/dL (ref 1.7–2.4)

## 2021-04-21 LAB — RESP PANEL BY RT-PCR (FLU A&B, COVID) ARPGX2
Influenza A by PCR: NEGATIVE
Influenza B by PCR: NEGATIVE
SARS Coronavirus 2 by RT PCR: NEGATIVE

## 2021-04-21 LAB — PHOSPHORUS: Phosphorus: 3.3 mg/dL (ref 2.5–4.6)

## 2021-04-21 MED ORDER — POLYETHYLENE GLYCOL 3350 17 G PO PACK
17.0000 g | PACK | Freq: Every day | ORAL | Status: DC
  Administered 2021-04-21 – 2021-04-23 (×3): 17 g via ORAL
  Filled 2021-04-21 (×3): qty 1

## 2021-04-21 MED ORDER — ONDANSETRON HCL 4 MG/2ML IJ SOLN
4.0000 mg | Freq: Four times a day (QID) | INTRAMUSCULAR | Status: DC | PRN
  Administered 2021-04-22 (×2): 4 mg via INTRAVENOUS
  Filled 2021-04-21 (×2): qty 2

## 2021-04-21 MED ORDER — NYSTATIN 100000 UNIT/GM EX POWD
Freq: Two times a day (BID) | CUTANEOUS | Status: DC
  Administered 2021-04-21 – 2021-04-22 (×2): 1 via TOPICAL
  Filled 2021-04-21: qty 15

## 2021-04-21 MED ORDER — ENOXAPARIN SODIUM 40 MG/0.4ML IJ SOSY
40.0000 mg | PREFILLED_SYRINGE | Freq: Every day | INTRAMUSCULAR | Status: DC
  Administered 2021-04-21 – 2021-04-23 (×3): 40 mg via SUBCUTANEOUS
  Filled 2021-04-21 (×4): qty 0.4

## 2021-04-21 MED ORDER — OXYCODONE HCL 5 MG PO TABS
5.0000 mg | ORAL_TABLET | Freq: Four times a day (QID) | ORAL | Status: DC | PRN
Start: 2021-04-21 — End: 2021-04-24

## 2021-04-21 MED ORDER — PANTOPRAZOLE SODIUM 40 MG PO TBEC
40.0000 mg | DELAYED_RELEASE_TABLET | Freq: Every day | ORAL | Status: DC
  Administered 2021-04-21 – 2021-04-24 (×4): 40 mg via ORAL
  Filled 2021-04-21 (×5): qty 1

## 2021-04-21 MED ORDER — ROSUVASTATIN CALCIUM 5 MG PO TABS
10.0000 mg | ORAL_TABLET | Freq: Every day | ORAL | Status: DC
  Administered 2021-04-21 – 2021-04-24 (×4): 10 mg via ORAL
  Filled 2021-04-21 (×4): qty 2

## 2021-04-21 MED ORDER — LACTATED RINGERS IV SOLN: INTRAVENOUS | Status: DC

## 2021-04-21 MED ORDER — ACETAMINOPHEN 325 MG PO TABS
650.0000 mg | ORAL_TABLET | Freq: Four times a day (QID) | ORAL | Status: DC | PRN
  Administered 2021-04-22: 650 mg via ORAL
  Filled 2021-04-21: qty 2

## 2021-04-21 MED ORDER — SODIUM CHLORIDE 0.9 % IV SOLN
2.0000 g | INTRAVENOUS | Status: DC
  Administered 2021-04-21 – 2021-04-23 (×3): 2 g via INTRAVENOUS
  Filled 2021-04-21: qty 20
  Filled 2021-04-21 (×3): qty 2

## 2021-04-21 MED ORDER — DICYCLOMINE HCL 10 MG PO CAPS
10.0000 mg | ORAL_CAPSULE | Freq: Four times a day (QID) | ORAL | Status: DC | PRN
  Administered 2021-04-22: 10 mg via ORAL
  Filled 2021-04-21: qty 1

## 2021-04-21 MED ORDER — MIRTAZAPINE 15 MG PO TABS
7.5000 mg | ORAL_TABLET | Freq: Every day | ORAL | Status: DC
  Administered 2021-04-23: 7.5 mg via ORAL
  Filled 2021-04-21: qty 1

## 2021-04-21 MED ORDER — LABETALOL HCL 5 MG/ML IV SOLN: 5.0000 mg | INTRAVENOUS | Status: DC | PRN

## 2021-04-21 MED ORDER — MORPHINE SULFATE (PF) 2 MG/ML IV SOLN
2.0000 mg | INTRAVENOUS | Status: DC | PRN
  Administered 2021-04-21 – 2021-04-22 (×3): 2 mg via INTRAVENOUS
  Filled 2021-04-21 (×4): qty 1

## 2021-04-21 MED ORDER — POLYETHYLENE GLYCOL 3350 17 G PO PACK: 17.0000 g | PACK | Freq: Every day | ORAL | Status: DC | PRN

## 2021-04-21 NOTE — Progress Notes (Signed)
PROGRESS NOTE    Briana Lee  YHC:[id number] DOB: 03-01-1946 DOA: 04/20/2021 PCP: Estell Harpin, Rutha Bouchard, NP  Brief Narrative: Briana Lee is a 75 y.o. female with medical history significant for asthma, GERD, esophagitis, essential tremor, hyperlipidemia, chronic anxiety/depression, who presented to [facility] with complaints of constipation of 4 days duration.  Associated with abdominal cramping and the urge to defecate but nothing comes out.  She took 2 suppositories and Fleet enema prior to presenting to the ED with minimal results.  In the ED abdominal x-ray showed nonspecific bowel gas pattern with mild stool burden.  She had a CT scan done without contrast of the abdomen and pelvis which showed abnormal wall thickening with surrounding stranding affecting the distal transverse colon and left colon.  Assessment & Plan:   Acute colitis, distal transverse colon -Etiology unclear, ischemic colitis versus infectious -Continue gentle IV fluids, add IV ceftriaxone, patient is allergic to multiple antibiotics -History of normal colonoscopy from 2 years ago except for polyps -Will request gastroenterology evaluation -Start clears  Hypertension -Stable, monitor  Chronic anxiety, depression -Continue home regimen  DVT prophylaxis: Lovenox Code Status: Full code Family Communication: Spouse at bedside Disposition Plan:  Status is: Observation  The patient will require care spanning > 2 midnights and should be moved to inpatient because: Inpatient level of care appropriate due to severity of illness  Dispo:  Patient From: Home  Planned Disposition: Home  Medically stable for discharge: No       Consultants:  Gastroenterology  Procedures:   Antimicrobials:    Subjective: -Denies any pain, continues to have intermittent rectal fullness  Objective: Vitals:   04/21/21 0126 04/21/21 0300 04/21/21 1131 04/21/21 1424  BP: (!) 159/87 (!) 169/76 138/63 (!) 162/79  Pulse: 88 89 76 83   Resp: 16 16 20 18   Temp: 99.3 F (37.4 C) 98.9 F (37.2 C) 99 F (37.2 C) 99.6 F (37.6 C)  TempSrc: Oral Oral Oral Oral  SpO2: 98% 100% 100% 100%  Weight:      Height:        Intake/Output Summary (Last 24 hours) at 04/21/2021 1438 Last data filed at 04/21/2021 0550 Gross per 24 hour  Intake 601.02 ml  Output --  Net 601.02 ml   Filed Weights   04/20/21 1100  Weight: 73.9 kg    Examination:  General exam: AAOx3, no distress Respiratory system: Clear to auscultation Cardiovascular system: S1 & S2 heard, RRR.  Abd: nondistended, soft and nontender.Normal bowel sounds heard. Central nervous system: Alert and oriented. No focal neurological deficits. Extremities: No edema Skin: No rashes Psychiatry: Judgement and insight appear normal. Mood & affect appropriate.     Data Reviewed:   CBC: Recent Labs  Lab 04/20/21 1143 04/21/21 0442  WBC 11.4* 11.8*  NEUTROABS 9.7*  --   HGB 13.2 12.3  HCT 40.4 36.9  MCV 89.4 88.7  PLT 196 176   Basic Metabolic Panel: Recent Labs  Lab 04/20/21 1143 04/21/21 0442  NA 137 139  K 3.6 3.6  CL 101 104  CO2 26 23  GLUCOSE 114* 100*  BUN 7* 10  CREATININE 0.80 0.77  CALCIUM 9.0 8.9  MG  --  2.0  PHOS  --  3.3   GFR: Estimated Creatinine Clearance: 61.5 mL/min (by C-G formula based on SCr of 0.77 mg/dL). Liver Function Tests: Recent Labs  Lab 04/20/21 1143  AST 20  ALT 12  ALKPHOS 55  BILITOT 0.8  PROT 7.3  ALBUMIN 3.5  Recent Labs  Lab 04/20/21 1143  LIPASE 24   No results for input(s): AMMONIA in the last 168 hours. Coagulation Profile: No results for input(s): INR, PROTIME in the last 168 hours. Cardiac Enzymes: No results for input(s): CKTOTAL, CKMB, CKMBINDEX, TROPONINI in the last 168 hours. BNP (last 3 results) No results for input(s): PROBNP in the last 8760 hours. HbA1C: No results for input(s): HGBA1C in the last 72 hours. CBG: No results for input(s): GLUCAP in the last 168 hours. Lipid  Profile: No results for input(s): CHOL, HDL, LDLCALC, TRIG, CHOLHDL, LDLDIRECT in the last 72 hours. Thyroid Function Tests: No results for input(s): TSH, T4TOTAL, FREET4, T3FREE, THYROIDAB in the last 72 hours. Anemia Panel: No results for input(s): VITAMINB12, FOLATE, FERRITIN, TIBC, IRON, RETICCTPCT in the last 72 hours. Urine analysis:    Component Value Date/Time   COLORURINE YELLOW 04/20/2021 1445   APPEARANCEUR CLEAR 04/20/2021 1445   LABSPEC 1.010 04/20/2021 1445   PHURINE 6.0 04/20/2021 1445   GLUCOSEU NEGATIVE 04/20/2021 1445   HGBUR TRACE (A) 04/20/2021 1445   BILIRUBINUR NEGATIVE 04/20/2021 1445   KETONESUR 15 (A) 04/20/2021 1445   PROTEINUR NEGATIVE 04/20/2021 1445   NITRITE NEGATIVE 04/20/2021 1445   LEUKOCYTESUR NEGATIVE 04/20/2021 1445   Sepsis Labs: @LABRCNTIP (procalcitonin:4,lacticidven:4)  ) Recent Results (from the past 240 hour(s))  Resp Panel by RT-PCR (Flu A&B, Covid) Nasopharyngeal Swab     Status: None   Collection Time: 04/20/21 11:21 PM   Specimen: Nasopharyngeal Swab; Nasopharyngeal(NP) swabs in vial transport medium  Result Value Ref Range Status   SARS Coronavirus 2 by RT PCR NEGATIVE NEGATIVE Final    Comment: (NOTE) SARS-CoV-2 target nucleic acids are NOT DETECTED.  The SARS-CoV-2 RNA is generally detectable in upper respiratory specimens during the acute phase of infection. The lowest concentration of SARS-CoV-2 viral copies this assay can detect is 138 copies/mL. A negative result does not preclude SARS-Cov-2 infection and should not be used as the sole basis for treatment or other patient management decisions. A negative result may occur with  improper specimen collection/handling, submission of specimen other than nasopharyngeal swab, presence of viral mutation(s) within the areas targeted by this assay, and inadequate number of viral copies(<138 copies/mL). A negative result must be combined with clinical observations, patient history,  and epidemiological information. The expected result is Negative.  Fact Sheet for Patients:  06/20/21  Fact Sheet for Healthcare Providers:  BloggerCourse.com  This test is no t yet approved or cleared by the [employer] FDA and  has been authorized for detection and/or diagnosis of SARS-CoV-2 by FDA under an Emergency Use Authorization (EUA). This EUA will remain  in effect (meaning this test can be used) for the duration of the COVID-19 declaration under Section 564(b)(1) of the Act, 21 U.S.C.section 360bbb-3(b)(1), unless the authorization is terminated  or revoked sooner.       Influenza A by PCR NEGATIVE NEGATIVE Final   Influenza B by PCR NEGATIVE NEGATIVE Final    Comment: (NOTE) The Xpert Xpress SARS-CoV-2/FLU/RSV plus assay is intended as an aid in the diagnosis of influenza from Nasopharyngeal swab specimens and should not be used as a sole basis for treatment. Nasal washings and aspirates are unacceptable for Xpert Xpress SARS-CoV-2/FLU/RSV testing.  Fact Sheet for Patients: [place]  Fact Sheet for Healthcare Providers: BloggerCourse.com  This test is not yet approved or cleared by the [employer] FDA and has been authorized for detection and/or diagnosis of SARS-CoV-2 by FDA under an Emergency Use Authorization (  EUA). This EUA will remain in effect (meaning this test can be used) for the duration of the COVID-19 declaration under Section 564(b)(1) of the Act, 21 U.S.C. section 360bbb-3(b)(1), unless the authorization is terminated or revoked.  Performed at [facility], [address], [place], [place] [address]          Radiology Studies: CT ABDOMEN PELVIS WO CONTRAST  Result Date: 04/20/2021 CLINICAL DATA:  Rectal pain.  Constipation.  Abdominal cramping. EXAM: CT ABDOMEN AND PELVIS WITHOUT CONTRAST TECHNIQUE:  Multidetector CT imaging of the abdomen and pelvis was performed following the standard protocol without IV contrast. COMPARISON:  Radiography same day.  CT 06/21/2011. FINDINGS: Lower chest: Lung bases are clear.  Moderate size hiatal hernia. Hepatobiliary: No calcified gallstones. Liver parenchyma appears normal. Pancreas: Normal Spleen: Normal Adrenals/Urinary Tract: Adrenal glands are normal. Kidneys are normal. Bladder is normal. Stomach/Bowel: Hiatal hernia as noted above. No acute stomach pathology. Small bowel appears normal. Right: And proximal transverse colon appear normal, with a moderate amount of stool. There is wall thickening of the distal transverse colon and left colon with mild surrounding edema. The differential diagnosis is ischemic colitis versus inflammatory colitis. No evidence of perforation or abscess. Vascular/Lymphatic: Aortic atherosclerosis. No aneurysm. IVC is normal. No adenopathy. Reproductive: No pelvic mass. Other: No free fluid or air. Musculoskeletal: Chronic appearing compression deformities at T12, L1, superior endplate L2 and L3. Lower lumbar degenerative changes. IMPRESSION: Abnormal wall thickening with surrounding stranding affecting the distal transverse colon and left colon. Differential diagnosis is ischemic colitis versus inflammatory colitis. No evidence of perforation or abscess. Moderate amount of fecal matter in the right colon and proximal transverse colon. Hiatal hernia Aortic Atherosclerosis (ICD10-I70.0). Old appearing compression deformities T12 through L3. Electronically Signed   By: Paulina Fusi M.D.   On: 04/20/2021 13:43   DG Abdomen 1 View  Result Date: 04/20/2021 CLINICAL DATA:  Constipation. EXAM: ABDOMEN - 1 VIEW COMPARISON:  None. FINDINGS: Small bowel is diffusely gas-filled but nondistended. Right colon is largely free of stool with some stool visible in the hepatic flexure. Transverse and left colon show small volume stool burden. No substantial  stool in the rectum. No unexpected abdominopelvic calcification. Bones are diffusely demineralized. IMPRESSION: 1. Nonspecific bowel gas pattern. 2. Overall mild stool burden. Electronically Signed   By: Kennith Center M.D.   On: 04/20/2021 12:44        Scheduled Meds:  enoxaparin (LOVENOX) injection  40 mg Subcutaneous Daily   mirtazapine  7.5 mg Oral QHS   nystatin   Topical BID   pantoprazole  40 mg Oral Daily   rosuvastatin  10 mg Oral Daily   Continuous Infusions:  cefTRIAXone (ROCEPHIN)  IV 2 g (04/21/21 1148)   lactated ringers 50 mL/hr at 04/21/21 0550     LOS: 0 days    Time spent:  Zannie Cove, MD [facility]   04/21/2021, 2:38 PM

## 2021-04-21 NOTE — Consult Note (Signed)
[facility] Consult: 1:29 PM 04/21/2021  LOS: 0 days    Referring Provider: Zannie Cove MD  Primary Care Physician: PCP is at [facility]. Primary Gastroenterologist: Karena Addison MD.      Reason for Consultation:  colitis   HPI: Briana Lee is a 75 y.o. female.  PMH below.  Additionally prior encounters mention anxiety, depression.  Lumbar spine spondylosis.  Vitamin D deficiency. History of adenomatous colon polyps.  Last colonoscopy as well as EGD were in 05/2019. EGD pathology showed benign esophagitis without Barrett's.  Small bowel biopsies negative for celiac disease.  No H. pylori on biopsies. Removed colon polyps which were tubular adenomas and sessile serrated polyp.  Recommended repeat colonoscopy in 3 years after a 2-day prep. Not have access to actual procedure reports but patient relays that she had dilation of her esophagus and rectum during the procedures that she has a narrow rectum.  Patient developed increased flatus on Saturday but had a bowel movement that day.  On Sunday she had continuing increase of flatus and was unable to have a bowel movement.  On Monday she took a fleets enema and passed a small amount of brown ball of stool.  The flatus continued.  She had no nausea or vomiting.  There was some rectal discomfort with a sense that she needed to have further bowel movements.  She became quite uncomfortable and came to the med [employer] [facility].  She was subsequently transferred to [facility] for admission. Patient plays tennis but last played tennis at least a week before onset of symptoms.  Admits to not drinking lots of fluids and that she may have become dehydrated.  Denies oliguria.  Denies medication changes other than this Monday, at least today after onset of above  symptoms, she was started on Crestor.  CTAP w/O contrast: Thickening and surrounding stranding involving distal transverse and left colon.  Differential includes ischemic vs factious colitis.   KUB: Mild stool burden.  Nonspecific BGP. WBCs 11.8.  Hb 12.3.  MCV 88 Normal GFR.  No significant electrolyte abnormalities.  LFTs normal. Lactic acid 2.7 ..  1.7.  Empirically started on Rocephin  Remote former smoker.  No alcohol. Father underwent colectomy, colostomy procedure when he was in his 90s due to complications of diverticulitis.  There is also a family history of colon polyps.  Past Medical History:  Diagnosis Date   Asthma    Episodic tension-type headache, not intractable    Essential tremor    High cholesterol    Hyperlipemia    Malaise    Vitamin D deficiency     Past Surgical History:  Procedure Laterality Date   TONSILLECTOMY      Prior to Admission medications   Medication Sig Start Date End Date Taking? Authorizing Provider  acetaminophen (TYLENOL) 500 MG tablet Take 1,000 mg by mouth every 6 (six) hours as needed for mild pain.   Yes [provider]  albuterol (PROVENTIL HFA;VENTOLIN HFA) 108 (90 Base) MCG/ACT inhaler Inhale 1-2 puffs into the lungs every 6 (six)  hours as needed for wheezing or shortness of breath. 09/26/14  Yes [provider]  azelastine (ASTELIN) 0.1 % nasal spray Place 1 spray into both nostrils 2 (two) times daily.   Yes [provider]  candesartan (ATACAND) 4 MG tablet Take 1 mg by mouth daily. 05/03/19  Yes [provider]  mirtazapine (REMERON) 15 MG tablet TAKE 1 TABLET BY MOUTH EVERYDAY AT BEDTIME Patient taking differently: 7.5 mg. 09/23/19  Yes Mozingo, Thereasa Solo, NP  pantoprazole (PROTONIX) 40 MG tablet Take 40 mg by mouth daily. 03/30/21  Yes [provider]  rosuvastatin (CRESTOR) 20 MG tablet Take 20 mg by mouth at bedtime. 04/19/21  Yes [provider]  Triamcinolone Acetonide  (NASACORT AQ NA) Place 1 spray into both nostrils daily.   Yes [provider]  Vitamin D, Ergocalciferol, (DRISDOL) 1.25 MG (50000 UT) CAPS capsule Take 50,000 Units by mouth every 7 (seven) days. Sunday 10/20/18  Yes [provider]  omeprazole (PRILOSEC) 20 MG capsule Take by mouth daily. Patient not taking: No sig reported 04/24/19   [provider]  rosuvastatin (CRESTOR) 10 MG tablet Take 10 mg by mouth daily. Patient not taking: No sig reported    [provider]    Scheduled Meds:  enoxaparin (LOVENOX) injection  40 mg Subcutaneous Daily   mirtazapine  7.5 mg Oral QHS   nystatin   Topical BID   pantoprazole  40 mg Oral Daily   rosuvastatin  10 mg Oral Daily   Infusions:  cefTRIAXone (ROCEPHIN)  IV 2 g (04/21/21 1148)   lactated ringers 50 mL/hr at 04/21/21 0550   PRN Meds: acetaminophen, dicyclomine, labetalol, morphine injection, ondansetron (ZOFRAN) IV, oxyCODONE, polyethylene glycol   Allergies as of 04/20/2021 - Review Complete 04/20/2021  Allergen Reaction Noted   Codeine Nausea And Vomiting 09/18/2018   Penicillins  09/18/2018   Sulfa antibiotics  09/18/2018    History reviewed. No pertinent family history.  Social History   Socioeconomic History   Marital status: Married    Spouse name: Not on file   Number of children: Not on file   Years of education: Not on file   Highest education level: Not on file  Occupational History   Not on file  Tobacco Use   Smoking status: Never   Smokeless tobacco: Never  Substance and Sexual Activity   Alcohol use: Never   Drug use: Never   Sexual activity: Not on file  Other Topics Concern   Not on file  Social History Narrative   Not on file   Social Determinants of Health   Financial Resource Strain: Not on file  Food Insecurity: Not on file  Transportation Needs: Not on file  Physical Activity: Not on file  Stress: Not on file  Social Connections: Not on file  Intimate  Partner Violence: Not on file    REVIEW OF SYSTEMS: Constitutional: Feels fatigued. ENT:  No nose bleeds Pulm: No shortness of breath or cough CV:  No palpitations, no LE edema.  No angina. GU:  No hematuria, no frequency GI: See HPI Heme: Unusual or excessive bleeding or bruising.  No GI bleeding. Transfusions: None. Neuro: Benign essential tremor, says this runs in the family.  No headaches, no peripheral tingling or numbness.  No dizziness, no syncope. Derm:  No itching, no rash or sores.  Endocrine:  No sweats or chills.  No polyuria or dysuria Immunization: Received COVID-19 vaccine.    PHYSICAL EXAM: Vital signs in last 24  hours: Vitals:   04/21/21 0300 04/21/21 1131  BP: (!) 169/76 138/63  Pulse: 89 76  Resp: 16 20  Temp: 98.9 F (37.2 C) 99 F (37.2 C)  SpO2: 100% 100%   Wt Readings from Last 3 Encounters:  04/20/21 73.9 kg  04/02/19 65.8 kg  03/23/19 67.1 kg    General: Anxious, somewhat uncomfortable, tremulous Head: No facial asymmetry or swelling.  No signs of head trauma. Eyes: No scleral icterus.  No conjunctival pallor.  EOMI Ears: Not hard of hearing Nose: No congestion or discharge Mouth: Tongue midline.  Mucosa moist, pink, clear. Neck: No JVD, no masses, no thyromegaly Lungs: Clear bilaterally without labored breathing or cough Heart: RRR.  No MRG.  S1, S2 present Abdomen: Soft without tenderness.  No HSM, masses, bruits, hernias..   Rectal: Not performed. Musc/Skeltl: No joint redness, swelling or gross deformity Extremities: No CCE. Neurologic: Oriented x3.  Shaking tremor of the arms/hands.  Moves all 4 limbs, strength not tested. Skin: No rash, no telangiectasia, no suspicious lesions. Nodes: No cervical adenopathy Psych: Anxious but cooperative, fluid speech.  Intake/Output from previous day: 08/02 0701 - 08/03 0700 In: 601 [I.V.:100.5; IV Piggyback:500.6] Out: -  Intake/Output this shift: No intake/output data recorded.  LAB  RESULTS: Recent Labs    04/20/21 1143 04/21/21 0442  WBC 11.4* 11.8*  HGB 13.2 12.3  HCT 40.4 36.9  PLT 196 176   BMET Lab Results  Component Value Date   NA 139 04/21/2021   NA 137 04/20/2021   NA 141 03/23/2019   K 3.6 04/21/2021   K 3.6 04/20/2021   K 3.7 03/23/2019   CL 104 04/21/2021   CL 101 04/20/2021   CL 104 03/23/2019   CO2 23 04/21/2021   CO2 26 04/20/2021   CO2 26 03/23/2019   GLUCOSE 100 (H) 04/21/2021   GLUCOSE 114 (H) 04/20/2021   GLUCOSE 102 (H) 03/23/2019   BUN 10 04/21/2021   BUN 7 (L) 04/20/2021   BUN 9 03/23/2019   CREATININE 0.77 04/21/2021   CREATININE 0.80 04/20/2021   CREATININE 0.69 03/23/2019   CALCIUM 8.9 04/21/2021   CALCIUM 9.0 04/20/2021   CALCIUM 9.2 03/23/2019   LFT Recent Labs    04/20/21 1143  PROT 7.3  ALBUMIN 3.5  AST 20  ALT 12  ALKPHOS 55  BILITOT 0.8   PT/INR No results found for: INR, PROTIME Hepatitis Panel No results for input(s): HEPBSAG, HCVAB, HEPAIGM, HEPBIGM in the last 72 hours. C-Diff No components found for: CDIFF Lipase     Component Value Date/Time   LIPASE 24 04/20/2021 1143    Drugs of Abuse  No results found for: LABOPIA, COCAINSCRNUR, LABBENZ, AMPHETMU, THCU, LABBARB   RADIOLOGY STUDIES: CT ABDOMEN PELVIS WO CONTRAST  Result Date: 04/20/2021 CLINICAL DATA:  Rectal pain.  Constipation.  Abdominal cramping. EXAM: CT ABDOMEN AND PELVIS WITHOUT CONTRAST TECHNIQUE: Multidetector CT imaging of the abdomen and pelvis was performed following the standard protocol without IV contrast. COMPARISON:  Radiography same day.  CT 06/21/2011. FINDINGS: Lower chest: Lung bases are clear.  Moderate size hiatal hernia. Hepatobiliary: No calcified gallstones. Liver parenchyma appears normal. Pancreas: Normal Spleen: Normal Adrenals/Urinary Tract: Adrenal glands are normal. Kidneys are normal. Bladder is normal. Stomach/Bowel: Hiatal hernia as noted above. No acute stomach pathology. Small bowel appears normal. Right:  And proximal transverse colon appear normal, with a moderate amount of stool. There is wall thickening of the distal transverse colon and left colon with mild surrounding edema.  The differential diagnosis is ischemic colitis versus inflammatory colitis. No evidence of perforation or abscess. Vascular/Lymphatic: Aortic atherosclerosis. No aneurysm. IVC is normal. No adenopathy. Reproductive: No pelvic mass. Other: No free fluid or air. Musculoskeletal: Chronic appearing compression deformities at T12, L1, superior endplate L2 and L3. Lower lumbar degenerative changes. IMPRESSION: Abnormal wall thickening with surrounding stranding affecting the distal transverse colon and left colon. Differential diagnosis is ischemic colitis versus inflammatory colitis. No evidence of perforation or abscess. Moderate amount of fecal matter in the right colon and proximal transverse colon. Hiatal hernia Aortic Atherosclerosis (ICD10-I70.0). Old appearing compression deformities T12 through L3. Electronically Signed   By: Paulina Fusi M.D.   On: 04/20/2021 13:43   DG Abdomen 1 View  Result Date: 04/20/2021 CLINICAL DATA:  Constipation. EXAM: ABDOMEN - 1 VIEW COMPARISON:  None. FINDINGS: Small bowel is diffusely gas-filled but nondistended. Right colon is largely free of stool with some stool visible in the hepatic flexure. Transverse and left colon show small volume stool burden. No substantial stool in the rectum. No unexpected abdominopelvic calcification. Bones are diffusely demineralized. IMPRESSION: 1. Nonspecific bowel gas pattern. 2. Overall mild stool burden. Electronically Signed   By: Kennith Center M.D.   On: 04/20/2021 12:44      IMPRESSION:      Distal transverse, left colon colitis.  This accompanied by mild leukocytosis but no fever.  Lots of gas and hard balls of stool.  Defecation urgency and rectal pain.  Suspect ischemic colitis.  Empiric Rocephin in place in case this is infectious colitis.  Patient has  history of tubular adenomatous and sessile serrated colon polyps.  Both type polyps were found at colonoscopy in 05/2019 and repeat study due 05/2022.    PLAN:        Continue supportive care.  ?  Need to pursue flexible sigmoidoscopy?  Dr Rhea Belton will see pt later today.   Remain on clears for now.  Continue LR currently running at 50 mL/h for a total of 24 hours through early tomorrow morning.   Jennye Moccasin  04/21/2021, 1:29 PM Phone [phone]

## 2021-04-21 NOTE — H&P (Signed)
History and Physical  Briana Lee FUX:[id number] DOB: 1946/04/20 DOA: 04/20/2021  Referring physician: Patient was accepted by Dr. Cyndia Bent and is a direct admission from [facility] ED. PCP: Estell Harpin, Rutha Bouchard, NP  Outpatient Specialists: GI Patient coming from: Home.  Chief Complaint: Constipation x4 days.  HPI: Briana Lee is a 75 y.o. female with medical history significant for asthma, GERD, esophagitis, essential tremor, hyperlipidemia, chronic anxiety/depression, who presented to [facility] with complaints of constipation of 4 days duration.  Associated with abdominal cramping and the urge to defecate but nothing comes out.  She took 2 suppositories and Fleet enema prior to presenting to the ED with minimal results.  In the ED abdominal x-ray showed nonspecific bowel gas pattern with mild stool burden.  She had a CT scan done without contrast of the abdomen and pelvis which showed abnormal wall thickening with surrounding stranding affecting the distal transverse colon and left colon.  Differential diagnosis ischemic colitis versus inflammatory colitis.  No evidence of perforation or abscess.  Moderate amount of fecal matter in the right colon and proximal transverse colon.  Hiatal hernia.  Aortic atherosclerosis.  Old appearing compression deformities T12-L3.  EDP discussed case with GI Dr. Elnoria Howard who recommended admission for observation and possible colonoscopy.  EDP initially requested admission at [facility] as patient requested but there were no beds available.  Patient was accepted by Dr. Cyndia Bent, [facility], and transferred to [facility] MedSurg unit, 6 N.  Endorses passing some mucus today with mild abdominal cramps.  No nausea or vomiting but appetite has been poor.  ED Course:  T-max 99.5.  BP 159/87, pulse 88, respiratory 16, O2 saturation 98% on room air.  Lab studies remarkable for mildly elevated serum glucose 114, lactic acid 2.7, repeat 1.7.  WBC 11.4, neutrophil count 9.7.  Review of  Systems: Review of systems as noted in the HPI. All other systems reviewed and are negative.   Past Medical History:  Diagnosis Date   Asthma    Episodic tension-type headache, not intractable    Essential tremor    High cholesterol    Hyperlipemia    Malaise    Vitamin D deficiency    Past Surgical History:  Procedure Laterality Date   TONSILLECTOMY      Social History:  reports that she has never smoked. She has never used smokeless tobacco. She reports that she does not drink alcohol and does not use drugs.   Allergies  Allergen Reactions   Codeine Nausea And Vomiting   Penicillins     unknown   Sulfa Antibiotics     rash   Family history: Mother with history of heart disease.  Prior to Admission medications   Medication Sig Start Date End Date Taking? Authorizing Provider  azelastine (ASTELIN) 0.1 % nasal spray Place 1 spray into both nostrils 2 (two) times daily. Use in each nostril as directed   Yes [provider]  albuterol (PROVENTIL HFA;VENTOLIN HFA) 108 (90 Base) MCG/ACT inhaler INHALE 1 TO 2 PUFFS BY MOUTH EVERY 4 TO 6 HOURS AS NEEDED 09/26/14   [provider]  atorvastatin (LIPITOR) 10 MG tablet Take 10 mg by mouth daily. 04/17/19   [provider]  candesartan (ATACAND) 4 MG tablet 1 mg. 05/03/19   [provider]  LORazepam (ATIVAN) 0.5 MG tablet Take 1 tablet (0.5 mg total) by mouth 2 (two) times daily. 05/09/19   Mozingo, Thereasa Solo, NP  mirtazapine (REMERON) 15 MG tablet TAKE 1 TABLET BY  MOUTH EVERYDAY AT BEDTIME Patient taking differently: 7.5 mg. 09/23/19   Mozingo, Thereasa Solo, NP  omeprazole (PRILOSEC) 20 MG capsule Take by mouth daily. 04/24/19   [provider]  rosuvastatin (CRESTOR) 10 MG tablet Take 10 mg by mouth daily.    [provider]  sertraline (ZOLOFT) 50 MG tablet  04/25/19   [provider]  Triamcinolone Acetonide (NASACORT AQ NA) Place into the nose.    [provider]  Vitamin D, Ergocalciferol, (DRISDOL) 1.25 MG (50000 UT) CAPS capsule TK 1 C PO 1 TIME A WK 10/20/18   [provider]    Physical Exam: BP (!) 159/87   Pulse 88   Temp 99.3 F (37.4 C) (Oral)   Resp 16   Ht 5\' 6"  (1.676 m)   Wt 73.9 kg   SpO2 98%   BMI 26.31 kg/m   General: 75 y.o. year-old female well developed well nourished in no acute distress.  Alert and oriented x3. Cardiovascular: Regular rate and rhythm with no rubs or gallops.  No thyromegaly or JVD noted.  No lower extremity edema. 2/4 pulses in all 4 extremities. Respiratory: Clear to auscultation with no wheezes or rales. Good inspiratory effort. Abdomen: Soft left lower quadrant tenderness with palpation.  Bowel sounds present.   Muskuloskeletal: No cyanosis, clubbing or edema noted bilaterally Neuro: CN II-XII intact, strength, sensation, reflexes.  Essential tremors needed. Skin: No ulcerative lesions noted or rashes Psychiatry: Judgement and insight appear normal. Mood is appropriate for condition and setting          Labs on Admission:  Basic Metabolic Panel: Recent Labs  Lab 04/20/21 1143  NA 137  K 3.6  CL 101  CO2 26  GLUCOSE 114*  BUN 7*  CREATININE 0.80  CALCIUM 9.0   Liver Function Tests: Recent Labs  Lab 04/20/21 1143  AST 20  ALT 12  ALKPHOS 55  BILITOT 0.8  PROT 7.3  ALBUMIN 3.5   Recent Labs  Lab 04/20/21 1143  LIPASE 24   No results for input(s): AMMONIA in the last 168 hours. CBC: Recent Labs  Lab 04/20/21 1143  WBC 11.4*  NEUTROABS 9.7*  HGB 13.2  HCT 40.4  MCV 89.4  PLT 196   Cardiac Enzymes: No results for input(s): CKTOTAL, CKMB, CKMBINDEX, TROPONINI in the last 168 hours.  BNP (last 3 results) No results for input(s): BNP in the last 8760 hours.  ProBNP (last 3 results) No results for input(s): PROBNP in the last 8760 hours.  CBG: No results for input(s): GLUCAP in the last 168 hours.  Radiological Exams on Admission: CT ABDOMEN  PELVIS WO CONTRAST  Result Date: 04/20/2021 CLINICAL DATA:  Rectal pain.  Constipation.  Abdominal cramping. EXAM: CT ABDOMEN AND PELVIS WITHOUT CONTRAST TECHNIQUE: Multidetector CT imaging of the abdomen and pelvis was performed following the standard protocol without IV contrast. COMPARISON:  Radiography same day.  CT 06/21/2011. FINDINGS: Lower chest: Lung bases are clear.  Moderate size hiatal hernia. Hepatobiliary: No calcified gallstones. Liver parenchyma appears normal. Pancreas: Normal Spleen: Normal Adrenals/Urinary Tract: Adrenal glands are normal. Kidneys are normal. Bladder is normal. Stomach/Bowel: Hiatal hernia as noted above. No acute stomach pathology. Small bowel appears normal. Right: And proximal transverse colon appear normal, with a moderate amount of stool. There is wall thickening of the distal transverse colon and left colon with mild surrounding edema. The differential diagnosis is ischemic colitis versus inflammatory colitis. No evidence of perforation or abscess. Vascular/Lymphatic: Aortic atherosclerosis. No  aneurysm. IVC is normal. No adenopathy. Reproductive: No pelvic mass. Other: No free fluid or air. Musculoskeletal: Chronic appearing compression deformities at T12, L1, superior endplate L2 and L3. Lower lumbar degenerative changes. IMPRESSION: Abnormal wall thickening with surrounding stranding affecting the distal transverse colon and left colon. Differential diagnosis is ischemic colitis versus inflammatory colitis. No evidence of perforation or abscess. Moderate amount of fecal matter in the right colon and proximal transverse colon. Hiatal hernia Aortic Atherosclerosis (ICD10-I70.0). Old appearing compression deformities T12 through L3. Electronically Signed   By: Paulina Fusi M.D.   On: 04/20/2021 13:43   DG Abdomen 1 View  Result Date: 04/20/2021 CLINICAL DATA:  Constipation. EXAM: ABDOMEN - 1 VIEW COMPARISON:  None. FINDINGS: Small bowel is diffusely gas-filled but  nondistended. Right colon is largely free of stool with some stool visible in the hepatic flexure. Transverse and left colon show small volume stool burden. No substantial stool in the rectum. No unexpected abdominopelvic calcification. Bones are diffusely demineralized. IMPRESSION: 1. Nonspecific bowel gas pattern. 2. Overall mild stool burden. Electronically Signed   By: Kennith Center M.D.   On: 04/20/2021 12:44    EKG: I independently viewed the EKG done and my findings are as followed: None available at the time of this visit.  Assessment/Plan Present on Admission:  Colitis  Active Problems:   Colitis  Presumptive colitis, seen on CT scan, ischemic versus inflammatory. Presented with constipation of 4 days duration.  Took some over-the-counter laxative suppositories and Fleet enema with onset of abdominal cramping with minimal results.  Denies any constitutional symptoms. X-ray was unrevealing CT scan revealed findings as stated above. EDP discussed case with GI Dr. Elnoria Howard recommended admission for observation and possible colonoscopy. Bentyl as needed for abdominal cramping, spasms.  Elevated BP, possibly contributed by pain Treat underlying conditions Not on oral antihypertensives at home IV labetalol as needed with parameters Continue to monitor vital signs.  Mild leukocytosis, suspect reactive from presumptive colitis seen on CT scan. Monitor for now off antibiotics Repeat CBC in the morning.  Chronic anxiety/depression/hyperlipidemia/GERD Resume home regimen.   DVT prophylaxis: Subcu Lovenox daily.  Code Status: Full code  Family Communication: None at bedside.  Disposition Plan: Admitted to MedSurg unit as observation status, by Dr. Cyndia Bent, [facility].  Consults called: GI, Dr. Elnoria Howard, consulted by EDP.  Admission status: Observation status.   Status is: Observation    Dispo:  Patient From: Home  Planned Disposition: Home, possibly on 04/22/2021 or when GI signs  off.  Medically stable for discharge: No      Darlin Drop MD [facility] Pager [phone]  If 7PM-7AM, please contact night-coverage [email or web address] Password [email or web address]  04/21/2021, 2:25 AM

## 2021-04-22 DIAGNOSIS — R933 Abnormal findings on diagnostic imaging of other parts of digestive tract: Secondary | ICD-10-CM | POA: Diagnosis not present

## 2021-04-22 DIAGNOSIS — R1084 Generalized abdominal pain: Secondary | ICD-10-CM | POA: Diagnosis not present

## 2021-04-22 LAB — CBC
HCT: 35.4 % — ABNORMAL LOW (ref 36.0–46.0)
Hemoglobin: 11.9 g/dL — ABNORMAL LOW (ref 12.0–15.0)
MCH: 29.8 pg (ref 26.0–34.0)
MCHC: 33.6 g/dL (ref 30.0–36.0)
MCV: 88.5 fL (ref 80.0–100.0)
Platelets: 170 10*3/uL (ref 150–400)
RBC: 4 MIL/uL (ref 3.87–5.11)
RDW: 12.8 % (ref 11.5–15.5)
WBC: 11 10*3/uL — ABNORMAL HIGH (ref 4.0–10.5)
nRBC: 0 % (ref 0.0–0.2)

## 2021-04-22 LAB — BASIC METABOLIC PANEL
Anion gap: 10 (ref 5–15)
BUN: 10 mg/dL (ref 8–23)
CO2: 23 mmol/L (ref 22–32)
Calcium: 8.5 mg/dL — ABNORMAL LOW (ref 8.9–10.3)
Chloride: 104 mmol/L (ref 98–111)
Creatinine, Ser: 0.82 mg/dL (ref 0.44–1.00)
GFR, Estimated: >60 mL/min (ref >60)
Glucose, Bld: 91 mg/dL (ref 70–99)
Potassium: 3.5 mmol/L (ref 3.5–5.1)
Sodium: 137 mmol/L (ref 135–145)

## 2021-04-22 NOTE — Progress Notes (Signed)
PROGRESS NOTE    Briana Lee  ZJI:[id number] DOB: Mar 28, 1946 DOA: 04/20/2021 PCP: Estell Harpin, Rutha Bouchard, NP  Brief Narrative: Briana Lee is a 75 y.o. female with medical history significant for asthma, GERD, esophagitis, essential tremor, hyperlipidemia, chronic anxiety/depression, who presented to [facility] with complaints of constipation of 4 days duration.  Associated with abdominal cramping and the urge to defecate but nothing comes out.  She took 2 suppositories and Fleet enema prior to presenting to the ED with minimal results.  In the ED abdominal x-ray showed nonspecific bowel gas pattern with mild stool burden.  She had a CT scan done without contrast of the abdomen and pelvis which showed abnormal wall thickening with surrounding stranding affecting the distal transverse colon and left colon.  Assessment & Plan:   Acute colitis, distal transverse colon -Etiology unclear, ischemic colitis less likely infectious -Clinically improving with supportive care, temp of 101 this morning, continue IV ceftriaxone, appreciate gastroenterology input, advance diet -History of normal colonoscopy from 2 years ago except for polyps -Ambulate, hopefully home tomorrow  Hypertension -Stable, monitor  Chronic anxiety, depression -Continue home regimen  DVT prophylaxis: Lovenox Code Status: Full code Family Communication: Spouse at bedside yesterday Disposition Plan:  Status is: Observation  The patient will require care spanning > 2 midnights and should be moved to inpatient because: Inpatient level of care appropriate due to severity of illness  Dispo:  Patient From: Home  Planned Disposition: Home  Medically stable for discharge: No       Consultants:  Gastroenterology  Procedures:   Antimicrobials:    Subjective: -Feels better today, denies any pain, rectal fullness/discomfort is improving  Objective: Vitals:   04/21/21 2000 04/22/21 0020 04/22/21 0435 04/22/21 0759  BP: (!)  170/84 (!) 160/80 123/65 139/69  Pulse: 82 84 84 72  Resp: 18 18 17 17   Temp: 99.4 F (37.4 C) 99.5 F (37.5 C) (!) 101 F (38.3 C) 98.7 F (37.1 C)  TempSrc: Oral Oral Oral Oral  SpO2: 100% 95% 97% 98%  Weight:      Height:        Intake/Output Summary (Last 24 hours) at 04/22/2021 1538 Last data filed at 04/22/2021 0441 Gross per 24 hour  Intake 1215.89 ml  Output --  Net 1215.89 ml   Filed Weights   04/20/21 1100  Weight: 73.9 kg    Examination:  General exam: Gen: Awake, Alert, Oriented X 3,  HEENT: no JVD Lungs: Good air movement bilaterally, CTAB CVS: S1S2/RRR Abd: soft, Non tender, non distended, BS present Extremities: No edema Skin: no new rashes on exposed skin Psychiatry: Judgement and insight appear normal. Mood & affect appropriate.     Data Reviewed:   CBC: Recent Labs  Lab 04/20/21 1143 04/21/21 0442 04/22/21 0021  WBC 11.4* 11.8* 11.0*  NEUTROABS 9.7*  --   --   HGB 13.2 12.3 11.9*  HCT 40.4 36.9 35.4*  MCV 89.4 88.7 88.5  PLT 196 176 170   Basic Metabolic Panel: Recent Labs  Lab 04/20/21 1143 04/21/21 0442 04/22/21 0021  NA 137 139 137  K 3.6 3.6 3.5  CL 101 104 104  CO2 26 23 23   GLUCOSE 114* 100* 91  BUN 7* 10 10  CREATININE 0.80 0.77 0.82  CALCIUM 9.0 8.9 8.5*  MG  --  2.0  --   PHOS  --  3.3  --    GFR: Estimated Creatinine Clearance: 60 mL/min (by C-G formula based on SCr of 0.82 mg/dL).  Liver Function Tests: Recent Labs  Lab 04/20/21 1143  AST 20  ALT 12  ALKPHOS 55  BILITOT 0.8  PROT 7.3  ALBUMIN 3.5   Recent Labs  Lab 04/20/21 1143  LIPASE 24   No results for input(s): AMMONIA in the last 168 hours. Coagulation Profile: No results for input(s): INR, PROTIME in the last 168 hours. Cardiac Enzymes: No results for input(s): CKTOTAL, CKMB, CKMBINDEX, TROPONINI in the last 168 hours. BNP (last 3 results) No results for input(s): PROBNP in the last 8760 hours. HbA1C: No results for input(s): HGBA1C in the  last 72 hours. CBG: No results for input(s): GLUCAP in the last 168 hours. Lipid Profile: No results for input(s): CHOL, HDL, LDLCALC, TRIG, CHOLHDL, LDLDIRECT in the last 72 hours. Thyroid Function Tests: No results for input(s): TSH, T4TOTAL, FREET4, T3FREE, THYROIDAB in the last 72 hours. Anemia Panel: No results for input(s): VITAMINB12, FOLATE, FERRITIN, TIBC, IRON, RETICCTPCT in the last 72 hours. Urine analysis:    Component Value Date/Time   COLORURINE YELLOW 04/20/2021 1445   APPEARANCEUR CLEAR 04/20/2021 1445   LABSPEC 1.010 04/20/2021 1445   PHURINE 6.0 04/20/2021 1445   GLUCOSEU NEGATIVE 04/20/2021 1445   HGBUR TRACE (A) 04/20/2021 1445   BILIRUBINUR NEGATIVE 04/20/2021 1445   KETONESUR 15 (A) 04/20/2021 1445   PROTEINUR NEGATIVE 04/20/2021 1445   NITRITE NEGATIVE 04/20/2021 1445   LEUKOCYTESUR NEGATIVE 04/20/2021 1445   Sepsis Labs: @LABRCNTIP (procalcitonin:4,lacticidven:4)  ) Recent Results (from the past 240 hour(s))  Resp Panel by RT-PCR (Flu A&B, Covid) Nasopharyngeal Swab     Status: None   Collection Time: 04/20/21 11:21 PM   Specimen: Nasopharyngeal Swab; Nasopharyngeal(NP) swabs in vial transport medium  Result Value Ref Range Status   SARS Coronavirus 2 by RT PCR NEGATIVE NEGATIVE Final    Comment: (NOTE) SARS-CoV-2 target nucleic acids are NOT DETECTED.  The SARS-CoV-2 RNA is generally detectable in upper respiratory specimens during the acute phase of infection. The lowest concentration of SARS-CoV-2 viral copies this assay can detect is 138 copies/mL. A negative result does not preclude SARS-Cov-2 infection and should not be used as the sole basis for treatment or other patient management decisions. A negative result may occur with  improper specimen collection/handling, submission of specimen other than nasopharyngeal swab, presence of viral mutation(s) within the areas targeted by this assay, and inadequate number of viral copies(<138  copies/mL). A negative result must be combined with clinical observations, patient history, and epidemiological information. The expected result is Negative.  Fact Sheet for Patients:  06/20/21  Fact Sheet for Healthcare Providers:  BloggerCourse.com  This test is no t yet approved or cleared by the SeriousBro[place] FDA and  has been authorized for detection and/or diagnosis of SARS-CoV-2 by FDA under an Emergency Use Authorization (EUA). This EUA will remain  in effect (meaning this test can be used) for the duration of the COVID-19 declaration under Section 564(b)(1) of the Act, 21 U.S.C.section 360bbb-3(b)(1), unless the authorization is terminated  or revoked sooner.       Influenza A by PCR NEGATIVE NEGATIVE Final   Influenza B by PCR NEGATIVE NEGATIVE Final    Comment: (NOTE) The Xpert Xpress SARS-CoV-2/FLU/RSV plus assay is intended as an aid in the diagnosis of influenza from Nasopharyngeal swab specimens and should not be used as a sole basis for treatment. Nasal washings and aspirates are unacceptable for Xpert Xpress SARS-CoV-2/FLU/RSV testing.  Fact Sheet for Patients: [place]  Fact Sheet for Healthcare Providers: BloggerCourse.com  This  test is not yet approved or cleared by the [place] and has been authorized for detection and/or diagnosis of SARS-CoV-2 by FDA under an Emergency Use Authorization (EUA). This EUA will remain in effect (meaning this test can be used) for the duration of the COVID-19 declaration under Section 564(b)(1) of the Act, 21 U.S.C. section 360bbb-3(b)(1), unless the authorization is terminated or revoked.  Performed at [facility], [address] [place], [place] [address]      Scheduled Meds:  enoxaparin (LOVENOX) injection  40 mg Subcutaneous Daily   mirtazapine  7.5 mg Oral QHS   nystatin    Topical BID   pantoprazole  40 mg Oral Daily   polyethylene glycol  17 g Oral Daily   rosuvastatin  10 mg Oral Daily   Continuous Infusions:  cefTRIAXone (ROCEPHIN)  IV 2 g (04/22/21 1245)     LOS: 0 days    Time spent:  Zannie Cove, MD [facility]   04/22/2021, 3:38 PM

## 2021-04-22 NOTE — Progress Notes (Addendum)
Daily Rounding Note  04/22/2021, 9:21 AM  LOS: 0 days   SUBJECTIVE:   Chief complaint:  colitis, abdominal pain, constipation.  Patient has had increase in formed stool.  They are still round balls of stool but they are larger in size and quantity.  No blood.  Passing some mucus as well.  Pain is better.  Some nausea this morning which was not triggered by breakfast.  No emesis.  Tolerating full liquids but appetite remains depressed. Temperature of 101F at 04 30 this morning.  OBJECTIVE:         Vital signs in last 24 hours:    Temp:  [98.7 F (37.1 C)-101 F (38.3 C)] 98.7 F (37.1 C) (08/04 0759) Pulse Rate:  [72-86] 72 (08/04 0759) Resp:  [17-20] 17 (08/04 0759) BP: (123-170)/(58-84) 139/69 (08/04 0759) SpO2:  [95 %-100 %] 98 % (08/04 0759) Last BM Date: 04/21/21 Filed Weights   04/20/21 1100  Weight: 73.9 kg   General: Anxious, comfortable, does not look ill. Heart: RRR Chest: No labored breathing or cough Abdomen: Soft without tenderness.  Active bowel sounds. Extremities: No CCE Neuro/Psych: Anxious, pleasant.  Fluid speech.  Upper extremity tremor.  Intake/Output from previous day: 08/03 0701 - 08/04 0700 In: 1477.4 [P.O.:300; I.V.:1095.9; IV Piggyback:81.5] Out: -   Intake/Output this shift: No intake/output data recorded.  Lab Results: Recent Labs    04/20/21 1143 04/21/21 0442 04/22/21 0021  WBC 11.4* 11.8* 11.0*  HGB 13.2 12.3 11.9*  HCT 40.4 36.9 35.4*  PLT 196 176 170   BMET Recent Labs    04/20/21 1143 04/21/21 0442 04/22/21 0021  NA 137 139 137  K 3.6 3.6 3.5  CL 101 104 104  CO2 26 23 23   GLUCOSE 114* 100* 91  BUN 7* 10 10  CREATININE 0.80 0.77 0.82  CALCIUM 9.0 8.9 8.5*   LFT Recent Labs    04/20/21 1143  PROT 7.3  ALBUMIN 3.5  AST 20  ALT 12  ALKPHOS 55  BILITOT 0.8   PT/INR No results for input(s): LABPROT, INR in the last 72 hours. Hepatitis Panel No  results for input(s): HEPBSAG, HCVAB, HEPAIGM, HEPBIGM in the last 72 hours.  Studies/Results: CT ABDOMEN PELVIS WO CONTRAST  Result Date: 04/20/2021 CLINICAL DATA:  Rectal pain.  Constipation.  Abdominal cramping. EXAM: CT ABDOMEN AND PELVIS WITHOUT CONTRAST TECHNIQUE: Multidetector CT imaging of the abdomen and pelvis was performed following the standard protocol without IV contrast. COMPARISON:  Radiography same day.  CT 06/21/2011. FINDINGS: Lower chest: Lung bases are clear.  Moderate size hiatal hernia. Hepatobiliary: No calcified gallstones. Liver parenchyma appears normal. Pancreas: Normal Spleen: Normal Adrenals/Urinary Tract: Adrenal glands are normal. Kidneys are normal. Bladder is normal. Stomach/Bowel: Hiatal hernia as noted above. No acute stomach pathology. Small bowel appears normal. Right: And proximal transverse colon appear normal, with a moderate amount of stool. There is wall thickening of the distal transverse colon and left colon with mild surrounding edema. The differential diagnosis is ischemic colitis versus inflammatory colitis. No evidence of perforation or abscess. Vascular/Lymphatic: Aortic atherosclerosis. No aneurysm. IVC is normal. No adenopathy. Reproductive: No pelvic mass. Other: No free fluid or air. Musculoskeletal: Chronic appearing compression deformities at T12, L1, superior endplate L2 and L3. Lower lumbar degenerative changes. IMPRESSION: Abnormal wall thickening with surrounding stranding affecting the distal transverse colon and left colon. Differential diagnosis is ischemic colitis versus inflammatory colitis. No evidence of perforation or abscess. Moderate amount  of fecal matter in the right colon and proximal transverse colon. Hiatal hernia Aortic Atherosclerosis (ICD10-I70.0). Old appearing compression deformities T12 through L3. Electronically Signed   By: Paulina Fusi M.D.   On: 04/20/2021 13:43   DG Abdomen 1 View  Result Date: 04/20/2021 CLINICAL DATA:   Constipation. EXAM: ABDOMEN - 1 VIEW COMPARISON:  None. FINDINGS: Small bowel is diffusely gas-filled but nondistended. Right colon is largely free of stool with some stool visible in the hepatic flexure. Transverse and left colon show small volume stool burden. No substantial stool in the rectum. No unexpected abdominopelvic calcification. Bones are diffusely demineralized. IMPRESSION: 1. Nonspecific bowel gas pattern. 2. Overall mild stool burden. Electronically Signed   By: Kennith Center M.D.   On: 04/20/2021 12:44    Scheduled Meds:  enoxaparin (LOVENOX) injection  40 mg Subcutaneous Daily   mirtazapine  7.5 mg Oral QHS   nystatin   Topical BID   pantoprazole  40 mg Oral Daily   polyethylene glycol  17 g Oral Daily   rosuvastatin  10 mg Oral Daily   Continuous Infusions:  cefTRIAXone (ROCEPHIN)  IV Stopped (04/21/21 1228)   PRN Meds:.acetaminophen, dicyclomine, labetalol, morphine injection, ondansetron (ZOFRAN) IV, oxyCODONE   ASSESMENT:   Acute left sided colitis, background chronic constipation.  Suspect ischemic colitis, less likely infectious colitis.  Unlikely IBD.  Empiric Rocephin day 2, WBC 11.8 >> 11 fever early this morning.  Daily Miralax day 2.  Abdominal pain improved, stool output improved.  Anxiety.  Takes Remeron at night.   PLAN      Advanced to soft diet.  Leave Rocephin in place.  Continue daily MiraLAX.  In future she can use this either daily or prn.       Briana Lee  04/22/2021, 9:21 AM Phone [phone]

## 2021-04-23 DIAGNOSIS — K529 Noninfective gastroenteritis and colitis, unspecified: Secondary | ICD-10-CM | POA: Diagnosis not present

## 2021-04-23 DIAGNOSIS — R1084 Generalized abdominal pain: Secondary | ICD-10-CM

## 2021-04-23 DIAGNOSIS — R933 Abnormal findings on diagnostic imaging of other parts of digestive tract: Secondary | ICD-10-CM

## 2021-04-23 LAB — CBC
HCT: 31.7 % — ABNORMAL LOW (ref 36.0–46.0)
Hemoglobin: 10.6 g/dL — ABNORMAL LOW (ref 12.0–15.0)
MCH: 29.5 pg (ref 26.0–34.0)
MCHC: 33.4 g/dL (ref 30.0–36.0)
MCV: 88.3 fL (ref 80.0–100.0)
Platelets: 155 10*3/uL (ref 150–400)
RBC: 3.59 MIL/uL — ABNORMAL LOW (ref 3.87–5.11)
RDW: 12.5 % (ref 11.5–15.5)
WBC: 8.9 10*3/uL (ref 4.0–10.5)
nRBC: 0 % (ref 0.0–0.2)

## 2021-04-23 NOTE — Progress Notes (Signed)
PROGRESS NOTE    Briana Lee  RSW:[id number] DOB: 1945/10/01 DOA: 04/20/2021 PCP: Estell Harpin, Rutha Bouchard, NP  Brief Narrative: Briana Lee is a 75 y.o. female with medical history significant for asthma, GERD, esophagitis, essential tremor, hyperlipidemia, chronic anxiety/depression, who presented to [facility] with complaints of constipation of 4 days duration.  Associated with abdominal cramping and the urge to defecate but nothing comes out.  She took 2 suppositories and Fleet enema prior to presenting to the ED with minimal results.  In the ED abdominal x-ray showed nonspecific bowel gas pattern with mild stool burden.  She had a CT scan done without contrast of the abdomen and pelvis which showed abnormal wall thickening with surrounding stranding affecting the distal transverse colon and left colon.  Assessment & Plan:   Acute colitis, distal transverse colon -ischemic colitis versus infectious -Clinically improving with supportive care, temp of 101 yesterday -Having frequent mucoid stools with urgency -Remains on IV ceftriaxone -Gastroenterology following, continue supportive care -History of normal colonoscopy from 2 years ago except for polyps -Increase activity as tolerated, hopefully home tomorrow  Hypertension -Stable, monitor  Chronic anxiety, depression -Continue home regimen  DVT prophylaxis: Lovenox Code Status: Full code Family Communication: Spouse at bedside yesterday Disposition Plan:  Status is: Observation  The patient will require care spanning > 2 midnights and should be moved to inpatient because: Inpatient level of care appropriate due to severity of illness  Dispo:  Patient From: Home  Planned Disposition: Home  Medically stable for discharge: No       Consultants:  Gastroenterology  Procedures:   Antimicrobials:    Subjective: -Numerous episodes of mucous stool, urgency  Objective: Vitals:   04/22/21 1930 04/23/21 0003 04/23/21 0426  04/23/21 1051  BP: (!) 146/76 139/65 123/60 140/72  Pulse: 87 80 74 76  Resp: 20 20 20 16   Temp: 98.7 F (37.1 C) 99.1 F (37.3 C) 98.3 F (36.8 C) 98.4 F (36.9 C)  TempSrc: Oral Oral Oral Oral  SpO2: 98% 96% 98% 100%  Weight:      Height:        Intake/Output Summary (Last 24 hours) at 04/23/2021 1429 Last data filed at 04/23/2021 0930 Gross per 24 hour  Intake 298.65 ml  Output --  Net 298.65 ml   Filed Weights   04/20/21 1100  Weight: 73.9 kg    Examination:  General exam: Gen: Awake, Alert, Oriented X 3,  HEENT: no JVD Lungs: Good air movement bilaterally, CTAB CVS: S1S2/RRR Abd: soft, Non tender, non distended, BS present Extremities: No edema Skin: no new rashes on exposed skin Psychiatry: Judgement and insight appear normal. Mood & affect appropriate.     Data Reviewed:   CBC: Recent Labs  Lab 04/20/21 1143 04/21/21 0442 04/22/21 0021 04/23/21 0136  WBC 11.4* 11.8* 11.0* 8.9  NEUTROABS 9.7*  --   --   --   HGB 13.2 12.3 11.9* 10.6*  HCT 40.4 36.9 35.4* 31.7*  MCV 89.4 88.7 88.5 88.3  PLT 196 176 170 155   Basic Metabolic Panel: Recent Labs  Lab 04/20/21 1143 04/21/21 0442 04/22/21 0021  NA 137 139 137  K 3.6 3.6 3.5  CL 101 104 104  CO2 26 23 23   GLUCOSE 114* 100* 91  BUN 7* 10 10  CREATININE 0.80 0.77 0.82  CALCIUM 9.0 8.9 8.5*  MG  --  2.0  --   PHOS  --  3.3  --    GFR: Estimated Creatinine Clearance: 60  mL/min (by C-G formula based on SCr of 0.82 mg/dL). Liver Function Tests: Recent Labs  Lab 04/20/21 1143  AST 20  ALT 12  ALKPHOS 55  BILITOT 0.8  PROT 7.3  ALBUMIN 3.5   Recent Labs  Lab 04/20/21 1143  LIPASE 24   No results for input(s): AMMONIA in the last 168 hours. Coagulation Profile: No results for input(s): INR, PROTIME in the last 168 hours. Cardiac Enzymes: No results for input(s): CKTOTAL, CKMB, CKMBINDEX, TROPONINI in the last 168 hours. BNP (last 3 results) No results for input(s): PROBNP in the last  8760 hours. HbA1C: No results for input(s): HGBA1C in the last 72 hours. CBG: No results for input(s): GLUCAP in the last 168 hours. Lipid Profile: No results for input(s): CHOL, HDL, LDLCALC, TRIG, CHOLHDL, LDLDIRECT in the last 72 hours. Thyroid Function Tests: No results for input(s): TSH, T4TOTAL, FREET4, T3FREE, THYROIDAB in the last 72 hours. Anemia Panel: No results for input(s): VITAMINB12, FOLATE, FERRITIN, TIBC, IRON, RETICCTPCT in the last 72 hours. Urine analysis:    Component Value Date/Time   COLORURINE YELLOW 04/20/2021 1445   APPEARANCEUR CLEAR 04/20/2021 1445   LABSPEC 1.010 04/20/2021 1445   PHURINE 6.0 04/20/2021 1445   GLUCOSEU NEGATIVE 04/20/2021 1445   HGBUR TRACE (A) 04/20/2021 1445   BILIRUBINUR NEGATIVE 04/20/2021 1445   KETONESUR 15 (A) 04/20/2021 1445   PROTEINUR NEGATIVE 04/20/2021 1445   NITRITE NEGATIVE 04/20/2021 1445   LEUKOCYTESUR NEGATIVE 04/20/2021 1445   Sepsis Labs: @LABRCNTIP (procalcitonin:4,lacticidven:4)  ) Recent Results (from the past 240 hour(s))  Resp Panel by RT-PCR (Flu A&B, Covid) Nasopharyngeal Swab     Status: None   Collection Time: 04/20/21 11:21 PM   Specimen: Nasopharyngeal Swab; Nasopharyngeal(NP) swabs in vial transport medium  Result Value Ref Range Status   SARS Coronavirus 2 by RT PCR NEGATIVE NEGATIVE Final    Comment: (NOTE) SARS-CoV-2 target nucleic acids are NOT DETECTED.  The SARS-CoV-2 RNA is generally detectable in upper respiratory specimens during the acute phase of infection. The lowest concentration of SARS-CoV-2 viral copies this assay can detect is 138 copies/mL. A negative result does not preclude SARS-Cov-2 infection and should not be used as the sole basis for treatment or other patient management decisions. A negative result may occur with  improper specimen collection/handling, submission of specimen other than nasopharyngeal swab, presence of viral mutation(s) within the areas targeted by this  assay, and inadequate number of viral copies(<138 copies/mL). A negative result must be combined with clinical observations, patient history, and epidemiological information. The expected result is Negative.  Fact Sheet for Patients:  06/20/21  Fact Sheet for Healthcare Providers:  BloggerCourse.com  This test is no t yet approved or cleared by the [employer] FDA and  has been authorized for detection and/or diagnosis of SARS-CoV-2 by FDA under an Emergency Use Authorization (EUA). This EUA will remain  in effect (meaning this test can be used) for the duration of the COVID-19 declaration under Section 564(b)(1) of the Act, 21 U.S.C.section 360bbb-3(b)(1), unless the authorization is terminated  or revoked sooner.       Influenza A by PCR NEGATIVE NEGATIVE Final   Influenza B by PCR NEGATIVE NEGATIVE Final    Comment: (NOTE) The Xpert Xpress SARS-CoV-2/FLU/RSV plus assay is intended as an aid in the diagnosis of influenza from Nasopharyngeal swab specimens and should not be used as a sole basis for treatment. Nasal washings and aspirates are unacceptable for Xpert Xpress SARS-CoV-2/FLU/RSV testing.  Fact Sheet for Patients:  BloggerCourse.com  Fact Sheet for Healthcare Providers: SeriousBroker.it  This test is not yet approved or cleared by the [place] FDA and has been authorized for detection and/or diagnosis of SARS-CoV-2 by FDA under an Emergency Use Authorization (EUA). This EUA will remain in effect (meaning this test can be used) for the duration of the COVID-19 declaration under Section 564(b)(1) of the Act, 21 U.S.C. section 360bbb-3(b)(1), unless the authorization is terminated or revoked.  Performed at [facility], [address] [place], [place] [address]      Scheduled Meds:  enoxaparin (LOVENOX) injection  40 mg Subcutaneous  Daily   mirtazapine  7.5 mg Oral QHS   nystatin   Topical BID   pantoprazole  40 mg Oral Daily   polyethylene glycol  17 g Oral Daily   rosuvastatin  10 mg Oral Daily   Continuous Infusions:     LOS: 0 days    Time spent:  Zannie Cove, MD [facility]   04/23/2021, 2:29 PM

## 2021-04-23 NOTE — Progress Notes (Signed)
          Daily Rounding Note  04/23/2021, 1:43 PM  LOS: 0 days   SUBJECTIVE:   Chief complaint:   Colitis. Still having frequent small-volume mucoid stools with a little bit of brown stool periodically.  Tolerating solid food.  No significant abdominal pain.  Does not quite feel ready to go home yet.  OBJECTIVE:         Vital signs in last 24 hours:    Temp:  [98.3 F (36.8 C)-99.1 F (37.3 C)] 98.4 F (36.9 C) (08/05 1051) Pulse Rate:  [74-87] 76 (08/05 1051) Resp:  [16-20] 16 (08/05 1051) BP: (123-146)/(60-76) 140/72 (08/05 1051) SpO2:  [96 %-100 %] 100 % (08/05 1051) Last BM Date: 04/22/21 Filed Weights   04/20/21 1100  Weight: 73.9 kg   General: Does not look ill but still a bit anxious Heart: RRR Chest: Clear bilaterally. Abdomen: Soft.  Not tender.  Active bowel sounds.  No distention Extremities: No CCE Neuro/Psych: Upper extremity tremor.  Intake/Output from previous day: 08/04 0701 - 08/05 0700 In: 118.7 [IV Piggyback:118.7] Out: -   Intake/Output this shift: No intake/output data recorded.  Lab Results: Recent Labs    04/21/21 0442 04/22/21 0021 04/23/21 0136  WBC 11.8* 11.0* 8.9  HGB 12.3 11.9* 10.6*  HCT 36.9 35.4* 31.7*  PLT 176 170 155   BMET Recent Labs    04/21/21 0442 04/22/21 0021  NA 139 137  K 3.6 3.5  CL 104 104  CO2 23 23  GLUCOSE 100* 91  BUN 10 10  CREATININE 0.77 0.82  CALCIUM 8.9 8.5*   LFT No results for input(s): PROT, ALBUMIN, AST, ALT, ALKPHOS, BILITOT, BILIDIR, IBILI in the last 72 hours. PT/INR No results for input(s): LABPROT, INR in the last 72 hours. Hepatitis Panel No results for input(s): HEPBSAG, HCVAB, HEPAIGM, HEPBIGM in the last 72 hours.  Studies/Results: No results found.  Scheduled Meds:  enoxaparin (LOVENOX) injection  40 mg Subcutaneous Daily   mirtazapine  7.5 mg Oral QHS   nystatin   Topical BID   pantoprazole  40 mg Oral Daily    polyethylene glycol  17 g Oral Daily   rosuvastatin  10 mg Oral Daily   Continuous Infusions:  cefTRIAXone (ROCEPHIN)  IV 2 g (04/23/21 1027)   PRN Meds:.acetaminophen, dicyclomine, labetalol, morphine injection, ondansetron (ZOFRAN) IV, oxyCODONE   ASSESMENT:   Left sided colitis with mild leukocytosis.  Still having frequent mostly mucoid stools.  Abdominal pain improved.  Empiric Rocephin in place however no documented infection.Marland Kitchen   PLAN   Continue observation.  Suspect she will be ready to go home tomorrow.  Stopping Rocephin.  GI MD for outpatient follow-up is Briana Lee in [place]    Briana Lee  04/23/2021, 1:43 PM Phone [phone]

## 2021-04-24 DIAGNOSIS — K529 Noninfective gastroenteritis and colitis, unspecified: Secondary | ICD-10-CM | POA: Diagnosis not present

## 2021-04-24 DIAGNOSIS — R1084 Generalized abdominal pain: Secondary | ICD-10-CM | POA: Diagnosis not present

## 2021-04-24 LAB — CBC
HCT: 33.1 % — ABNORMAL LOW (ref 36.0–46.0)
Hemoglobin: 11.2 g/dL — ABNORMAL LOW (ref 12.0–15.0)
MCH: 29.7 pg (ref 26.0–34.0)
MCHC: 33.8 g/dL (ref 30.0–36.0)
MCV: 87.8 fL (ref 80.0–100.0)
Platelets: 123 10*3/uL — ABNORMAL LOW (ref 150–400)
RBC: 3.77 MIL/uL — ABNORMAL LOW (ref 3.87–5.11)
RDW: 12.6 % (ref 11.5–15.5)
WBC: 7.5 10*3/uL (ref 4.0–10.5)
nRBC: 0 % (ref 0.0–0.2)

## 2021-04-24 LAB — BASIC METABOLIC PANEL
Anion gap: 9 (ref 5–15)
BUN: 7 mg/dL — ABNORMAL LOW (ref 8–23)
CO2: 25 mmol/L (ref 22–32)
Calcium: 8.1 mg/dL — ABNORMAL LOW (ref 8.9–10.3)
Chloride: 103 mmol/L (ref 98–111)
Creatinine, Ser: 0.8 mg/dL (ref 0.44–1.00)
GFR, Estimated: >60 mL/min (ref >60)
Glucose, Bld: 96 mg/dL (ref 70–99)
Potassium: 3.6 mmol/L (ref 3.5–5.1)
Sodium: 137 mmol/L (ref 135–145)

## 2021-04-24 MED ORDER — POLYETHYLENE GLYCOL 3350 17 G PO PACK: 17.0000 g | PACK | Freq: Every day | ORAL | 1 refills | Status: AC

## 2021-04-24 NOTE — Progress Notes (Signed)
Discharge instructions reviewed with patient, Briana Lee and her husband. PIV removed with tip intact. Discharge AVS provided with highlighted next doses for medications.  Patient aware of need to make her own follow up appointment with Dr. Lanae Boast for Gastroenterology. Patient aware to pick up prescription for Miralax at pharmacy on the way home. Wheeled to lobby and discharged to private vehicle in stable condition.

## 2021-04-24 NOTE — Discharge Summary (Signed)
Physician Discharge Summary  Briana MuscatJane Lee ZOX:[id number] DOB: 1946-06-18 DOA: 04/20/2021  PCP: Estell HarpinGarner Brown, Rutha BouchardKathy Leigh, NP  Admit date: 04/20/2021 Discharge date: 04/24/2021  Time spent: 35 minutes  Recommendations for Outpatient Follow-up:  Primary gastroenterologist Dr. Karena AddisonMary Shearin in 4 weeks   Discharge Diagnoses:  Active Problems:   Colitis   Generalized abdominal pain Essential tremor Anxiety GERD History of asthma  Discharge Condition: Stable  Diet recommendation: Heart healthy  Filed Weights   04/20/21 1100  Weight: 73.9 kg    History of present illness:  75 year old female history of asthma, GERD, esophagitis, essential tremor, anxiety [facility] with complaints of constipation of 4 days duration.  Associated with abdominal cramping and the urge to defecate but nothing comes out.  She took 2 suppositories and Fleet enema prior to presenting to the ED with minimal results.  In the ED abdominal x-ray showed nonspecific bowel gas pattern with mild stool burden.  She had a CT scan done without contrast of the abdomen and pelvis which showed abnormal wall thickening with surrounding stranding affecting the distal transverse colon and left colon. Hospital course   Acute colitis, distal transverse colon -ischemic colitis suspected, less likely infectious -Improving with supportive care, treated with IV ceftriaxone as well -Seen by gastroenterology in consultation, recommended adding MiraLAX to her home regimen, history of normal colonoscopy 2 years ago except for polyps -Discharged home in a stable condition, recommended follow-up with her primary gastroenterologist in [facility] in 4 weeks  Hypertension -Stable, monitor  Chronic anxiety, depression -Continue home regimen  Consultants:  Gastroenterology   Discharge Exam: Vitals:   04/24/21 0409 04/24/21 0838  BP: (!) 141/69 127/87  Pulse: 76 79  Resp: 20 20  Temp: 99 F (37.2 C) 99.6 F (37.6 C)  SpO2:  94% 96%    General: AAOx3 Cardiovascular: S1S2/RRR Respiratory: CTAB  Discharge Instructions   Discharge Instructions     Diet - low sodium heart healthy   Complete by: As directed    Increase activity slowly   Complete by: As directed       Allergies as of 04/24/2021       Reactions   Codeine Nausea And Vomiting   Penicillins    unknown   Sulfa Antibiotics    rash        Medication List     STOP taking these medications    omeprazole 20 MG capsule Commonly known as: PRILOSEC       TAKE these medications    acetaminophen 500 MG tablet Commonly known as: TYLENOL Take 1,000 mg by mouth every 6 (six) hours as needed for mild pain.   albuterol 108 (90 Base) MCG/ACT inhaler Commonly known as: VENTOLIN HFA Inhale 1-2 puffs into the lungs every 6 (six) hours as needed for wheezing or shortness of breath.   azelastine 0.1 % nasal spray Commonly known as: ASTELIN Place 1 spray into both nostrils 2 (two) times daily.   candesartan 4 MG tablet Commonly known as: ATACAND Take 1 mg by mouth daily.   NASACORT AQ NA Place 1 spray into both nostrils daily.   pantoprazole 40 MG tablet Commonly known as: PROTONIX Take 40 mg by mouth daily.   polyethylene glycol 17 g packet Commonly known as: MIRALAX / GLYCOLAX Take 17 g by mouth daily.   rosuvastatin 20 MG tablet Commonly known as: CRESTOR Take 20 mg by mouth at bedtime.   Vitamin D (Ergocalciferol) 1.25 MG (50000 UNIT) Caps capsule Commonly known as:  DRISDOL Take 50,000 Units by mouth every 7 (seven) days. Sunday       ASK your doctor about these medications    mirtazapine 15 MG tablet Commonly known as: REMERON TAKE 1 TABLET BY MOUTH EVERYDAY AT BEDTIME       Allergies  Allergen Reactions   Codeine Nausea And Vomiting   Penicillins     unknown   Sulfa Antibiotics     rash    Follow-up Information     Sydnee Cabal., MD. Call.   Specialty: Gastroenterology Why: for appointement for  follow up of GI issues. Contact information: [address] [place] [address]                  The results of significant diagnostics from this hospitalization (including imaging, microbiology, ancillary and laboratory) are listed below for reference.    Significant Diagnostic Studies: CT ABDOMEN PELVIS WO CONTRAST  Result Date: 04/20/2021 CLINICAL DATA:  Rectal pain.  Constipation.  Abdominal cramping. EXAM: CT ABDOMEN AND PELVIS WITHOUT CONTRAST TECHNIQUE: Multidetector CT imaging of the abdomen and pelvis was performed following the standard protocol without IV contrast. COMPARISON:  Radiography same day.  CT 06/21/2011. FINDINGS: Lower chest: Lung bases are clear.  Moderate size hiatal hernia. Hepatobiliary: No calcified gallstones. Liver parenchyma appears normal. Pancreas: Normal Spleen: Normal Adrenals/Urinary Tract: Adrenal glands are normal. Kidneys are normal. Bladder is normal. Stomach/Bowel: Hiatal hernia as noted above. No acute stomach pathology. Small bowel appears normal. Right: And proximal transverse colon appear normal, with a moderate amount of stool. There is wall thickening of the distal transverse colon and left colon with mild surrounding edema. The differential diagnosis is ischemic colitis versus inflammatory colitis. No evidence of perforation or abscess. Vascular/Lymphatic: Aortic atherosclerosis. No aneurysm. IVC is normal. No adenopathy. Reproductive: No pelvic mass. Other: No free fluid or air. Musculoskeletal: Chronic appearing compression deformities at T12, L1, superior endplate L2 and L3. Lower lumbar degenerative changes. IMPRESSION: Abnormal wall thickening with surrounding stranding affecting the distal transverse colon and left colon. Differential diagnosis is ischemic colitis versus inflammatory colitis. No evidence of perforation or abscess. Moderate amount of fecal matter in the right colon and proximal transverse colon. Hiatal hernia  Aortic Atherosclerosis (ICD10-I70.0). Old appearing compression deformities T12 through L3. Electronically Signed   By: Paulina Fusi M.D.   On: 04/20/2021 13:43   DG Abdomen 1 View  Result Date: 04/20/2021 CLINICAL DATA:  Constipation. EXAM: ABDOMEN - 1 VIEW COMPARISON:  None. FINDINGS: Small bowel is diffusely gas-filled but nondistended. Right colon is largely free of stool with some stool visible in the hepatic flexure. Transverse and left colon show small volume stool burden. No substantial stool in the rectum. No unexpected abdominopelvic calcification. Bones are diffusely demineralized. IMPRESSION: 1. Nonspecific bowel gas pattern. 2. Overall mild stool burden. Electronically Signed   By: Kennith Center M.D.   On: 04/20/2021 12:44    Microbiology: Recent Results (from the past 240 hour(s))  Resp Panel by RT-PCR (Flu A&B, Covid) Nasopharyngeal Swab     Status: None   Collection Time: 04/20/21 11:21 PM   Specimen: Nasopharyngeal Swab; Nasopharyngeal(NP) swabs in vial transport medium  Result Value Ref Range Status   SARS Coronavirus 2 by RT PCR NEGATIVE NEGATIVE Final    Comment: (NOTE) SARS-CoV-2 target nucleic acids are NOT DETECTED.  The SARS-CoV-2 RNA is generally detectable in upper respiratory specimens during the acute phase of infection. The lowest concentration of SARS-CoV-2 viral copies  this assay can detect is 138 copies/mL. A negative result does not preclude SARS-Cov-2 infection and should not be used as the sole basis for treatment or other patient management decisions. A negative result may occur with  improper specimen collection/handling, submission of specimen other than nasopharyngeal swab, presence of viral mutation(s) within the areas targeted by this assay, and inadequate number of viral copies(<138 copies/mL). A negative result must be combined with clinical observations, patient history, and epidemiological information. The expected result is Negative.  Fact  Sheet for Patients:  BloggerCourse.com  Fact Sheet for Healthcare Providers:  SeriousBroker.it  This test is no t yet approved or cleared by the [place] FDA and  has been authorized for detection and/or diagnosis of SARS-CoV-2 by FDA under an Emergency Use Authorization (EUA). This EUA will remain  in effect (meaning this test can be used) for the duration of the COVID-19 declaration under Section 564(b)(1) of the Act, 21 U.S.C.section 360bbb-3(b)(1), unless the authorization is terminated  or revoked sooner.       Influenza A by PCR NEGATIVE NEGATIVE Final   Influenza B by PCR NEGATIVE NEGATIVE Final    Comment: (NOTE) The Xpert Xpress SARS-CoV-2/FLU/RSV plus assay is intended as an aid in the diagnosis of influenza from Nasopharyngeal swab specimens and should not be used as a sole basis for treatment. Nasal washings and aspirates are unacceptable for Xpert Xpress SARS-CoV-2/FLU/RSV testing.  Fact Sheet for Patients: BloggerCourse.com  Fact Sheet for Healthcare Providers: SeriousBroker.it  This test is not yet approved or cleared by the [place] FDA and has been authorized for detection and/or diagnosis of SARS-CoV-2 by FDA under an Emergency Use Authorization (EUA). This EUA will remain in effect (meaning this test can be used) for the duration of the COVID-19 declaration under Section 564(b)(1) of the Act, 21 U.S.C. section 360bbb-3(b)(1), unless the authorization is terminated or revoked.  Performed at [facility], [address] [place], [place] [address]      Labs: Basic Metabolic Panel: Recent Labs  Lab 04/20/21 1143 04/21/21 0442 04/22/21 0021 04/24/21 0018  NA 137 139 137 137  K 3.6 3.6 3.5 3.6  CL 101 104 104 103  CO2 26 23 23 25   GLUCOSE 114* 100* 91 96  BUN 7* 10 10 7*  CREATININE 0.80 0.77 0.82 0.80  CALCIUM 9.0 8.9 8.5*  8.1*  MG  --  2.0  --   --   PHOS  --  3.3  --   --    Liver Function Tests: Recent Labs  Lab 04/20/21 1143  AST 20  ALT 12  ALKPHOS 55  BILITOT 0.8  PROT 7.3  ALBUMIN 3.5   Recent Labs  Lab 04/20/21 1143  LIPASE 24   No results for input(s): AMMONIA in the last 168 hours. CBC: Recent Labs  Lab 04/20/21 1143 04/21/21 0442 04/22/21 0021 04/23/21 0136 04/24/21 0018  WBC 11.4* 11.8* 11.0* 8.9 7.5  NEUTROABS 9.7*  --   --   --   --   HGB 13.2 12.3 11.9* 10.6* 11.2*  HCT 40.4 36.9 35.4* 31.7* 33.1*  MCV 89.4 88.7 88.5 88.3 87.8  PLT 196 176 170 155 123*   Cardiac Enzymes: No results for input(s): CKTOTAL, CKMB, CKMBINDEX, TROPONINI in the last 168 hours. BNP: BNP (last 3 results) No results for input(s): BNP in the last 8760 hours.  ProBNP (last 3 results) No results for input(s): PROBNP in the last 8760 hours.  CBG: No results  for input(s): GLUCAP in the last 168 hours.     Signed:  Zannie Cove MD.  [facility] 04/24/2021, 12:34 PM

## 2021-12-10 ENCOUNTER — Encounter: Payer: Self-pay | Admitting: Adult Health

## 2021-12-10 ENCOUNTER — Other Ambulatory Visit: Payer: Self-pay

## 2021-12-10 ENCOUNTER — Ambulatory Visit (INDEPENDENT_AMBULATORY_CARE_PROVIDER_SITE_OTHER): Payer: Medicare PPO | Admitting: Adult Health

## 2021-12-10 DIAGNOSIS — G4700 Insomnia, unspecified: Secondary | ICD-10-CM | POA: Diagnosis not present

## 2021-12-10 DIAGNOSIS — F411 Generalized anxiety disorder: Secondary | ICD-10-CM

## 2021-12-10 MED ORDER — MIRTAZAPINE 15 MG PO TABS: 7.5000 mg | ORAL_TABLET | Freq: Every day | ORAL | 2 refills | Status: AC

## 2021-12-10 MED ORDER — LORAZEPAM 0.5 MG PO TABS: 0.5000 mg | ORAL_TABLET | Freq: Every day | ORAL | 2 refills | Status: AC | PRN

## 2021-12-10 NOTE — Progress Notes (Signed)
[facility] MD/PA/NP Initial Note ? ?12/10/2021 2:05 PM ?Briana Lee  ?MRN:  [id number] ? ?Chief Complaint:  ? ?HPI:  ? ?Describes mood today as "nervous". Pleasant. Tearful at times. Mood symptoms - denies depression and irritability. Increased anxiety. Stating "I'm waking up with major anxiety. Worries about the things she has to do. Feels uncomfortable and uneasy. Has difficulties "settling" down. Stating "I haven't learned to deal with my anxiety over the years". Reporting increased fear and worry. Stating "I'm not doing well". Reports an episode while on a cruise in July of choking and getting the heimlich maneuver from a bystander. She was relieved from choking, but is experiencing ongoing effects from the event. She is now having difficulties eating and swallowing food. Stating "I have a fear of coughing and choking". She has lost over 30 pounds and is careful of what she eats. Has also developed acid reflux which is contributing to her anxiety - now taking 2 antacids. Stopped Remeron in July because she didn't feel "right". Is willing to restart the Remeron - "It was helpful over the past 2 years". Has historically suffered from numerous side effects and intolerability with other medications. Varying interest and motivation. Taking medications, but not as prescribed.  ?Energy levels have been lower. Active, has a regular exercise routine. Playing tennis.  ?Enjoys some usual interests and activities. Married. Lives with husband. Spending time with family and friends.   ?Appetite adequate. Stating "I'm eating". Weight loss - 30 pounds.   ?Sleeping better over past few nights. Averages 5 to 6 hours. Denies daytime napping. ?Focus and concentration stable. Completing tasks. Managing aspects of household. Working puzzles and games. Taking french lessons every day. ?Denies SI or HI.  ?Denies AH or VH. ? ?Previous medications: Zoloft - bruxism ? ?Visit Diagnosis:  ?  ICD-10-CM   ?1. Generalized anxiety disorder  F41.1  mirtazapine (REMERON) 15 MG tablet  ?  LORazepam (ATIVAN) 0.5 MG tablet  ?  ?2. Insomnia, unspecified type  G47.00 mirtazapine (REMERON) 15 MG tablet  ?  ? ? ?Past Psychiatric History: Denies psychiatric hospitalization.  ? ?Past Medical History:  ?Past Medical History:  ?Diagnosis Date  ? Asthma   ? Episodic tension-type headache, not intractable   ? Essential tremor   ? High cholesterol   ? Hyperlipemia   ? Malaise   ? Vitamin D deficiency   ?  ?Past Surgical History:  ?Procedure Laterality Date  ? TONSILLECTOMY    ? ? ?Family Psychiatric History: Family history of mental illness.  ? ?Family History: No family history on file. ? ?Social History:  ?Social History  ? ?Socioeconomic History  ? Marital status: Married  ?  Spouse name: Not on file  ? Number of children: Not on file  ? Years of education: Not on file  ? Highest education level: Not on file  ?Occupational History  ? Not on file  ?Tobacco Use  ? Smoking status: Never  ? Smokeless tobacco: Never  ?Substance and Sexual Activity  ? Alcohol use: Never  ? Drug use: Never  ? Sexual activity: Not on file  ?Other Topics Concern  ? Not on file  ?Social History Narrative  ? Not on file  ? ?Social Determinants of Health  ? ?Financial Resource Strain: Not on file  ?Food Insecurity: Not on file  ?Transportation Needs: Not on file  ?Physical Activity: Not on file  ?Stress: Not on file  ?Social Connections: Not on file  ? ? ?Allergies:  ?Allergies  ?Allergen Reactions  ?  Codeine Nausea And Vomiting  ? Penicillins   ?  unknown  ? Sulfa Antibiotics   ?  rash  ? ? ?Metabolic Disorder Labs: ?No results found for: HGBA1C, MPG ?No results found for: PROLACTIN ?No results found for: CHOL, TRIG, HDL, CHOLHDL, VLDL, LDLCALC ?No results found for: TSH ? ?Therapeutic Level Labs: ?No results found for: LITHIUM ?No results found for: VALPROATE ?No components found for:  CBMZ ? ?Current Medications: ?Current Outpatient Medications  ?Medication Sig Dispense Refill  ? LORazepam  (ATIVAN) 0.5 MG tablet Take 1 tablet (0.5 mg total) by mouth daily as needed for anxiety. 30 tablet 2  ? acetaminophen (TYLENOL) 500 MG tablet Take 1,000 mg by mouth every 6 (six) hours as needed for mild pain.    ? albuterol (PROVENTIL HFA;VENTOLIN HFA) 108 (90 Base) MCG/ACT inhaler Inhale 1-2 puffs into the lungs every 6 (six) hours as needed for wheezing or shortness of breath.    ? azelastine (ASTELIN) 0.1 % nasal spray Place 1 spray into both nostrils 2 (two) times daily.    ? candesartan (ATACAND) 4 MG tablet Take 1 mg by mouth daily.    ? mirtazapine (REMERON) 15 MG tablet Take 0.5 tablets (7.5 mg total) by mouth at bedtime. 30 tablet 2  ? pantoprazole (PROTONIX) 40 MG tablet Take 40 mg by mouth daily.    ? polyethylene glycol (MIRALAX / GLYCOLAX) 17 g packet Take 17 g by mouth daily. 14 each 1  ? rosuvastatin (CRESTOR) 20 MG tablet Take 20 mg by mouth at bedtime.    ? Triamcinolone Acetonide (NASACORT AQ NA) Place 1 spray into both nostrils daily.    ? Vitamin D, Ergocalciferol, (DRISDOL) 1.25 MG (50000 UT) CAPS capsule Take 50,000 Units by mouth every 7 (seven) days. Sunday    ? ?No current facility-administered medications for this visit.  ? ? ?Medication Side Effects: none ? ?Orders placed this visit:  No orders of the defined types were placed in this encounter. ? ? ?Psychiatric Specialty Exam: ? ?Review of Systems  ?Musculoskeletal:  Negative for gait problem.  ?Neurological:  Negative for tremors.  ?Psychiatric/Behavioral:    ?     Please refer to HPI   ?There were no vitals taken for this visit.There is no height or weight on file to calculate BMI.  ?General Appearance: Casual and Neat  ?Eye Contact:  Good  ?Speech:  Clear and Coherent and Normal Rate  ?Volume:  Normal  ?Mood:  Anxious  ?Affect:  Appropriate and Congruent  ?Thought Process:  Coherent and Descriptions of Associations: Intact  ?Orientation:  Full (Time, Place, and Person)  ?Thought Content: Logical   ?Suicidal Thoughts:  No  ?Homicidal  Thoughts:  No  ?Memory:  WNL  ?Judgement:  Good  ?Insight:  Good  ?Psychomotor Activity:  Normal  ?Concentration:  Concentration: Good  ?Recall:  Good  ?Fund of Knowledge: Good  ?Language: Good  ?Assets:  Communication Skills ?Desire for Improvement ?Financial Resources/Insurance ?Housing ?Intimacy ?Leisure Time ?Physical Health ?Resilience ?Social Support ?Talents/Skills ?Transportation ?Vocational/Educational  ?ADL's:  Intact  ?Cognition: WNL  ?Prognosis:  Good  ? ?Screenings:  ?Flowsheet Row ED to Hosp-Admission (Discharged) from 04/20/2021 in [facility]  SURGICAL  ?C-SSRS RISK CATEGORY No Risk  ? ?  ? ? ?Receiving Psychotherapy: No  ? ?Treatment Plan/Recommendations:  ? ?Plan: ? ?Restart Ativan 0.5mg  daily ?Restart Remeron 7.5mg  at hs  ? ?RTC 4 weeks ? ?Time spent with patient was 60 minutes. Greater than 50%  of face to face time with patient was spent on counseling and coordination of care.   ? ?Patient advised to contact office with any questions, adverse effects, or acute worsening in signs and symptoms. ? ?Discussed potential benefits, risk, and side effects of benzodiazepines to include potential risk of tolerance and dependence, as well as possible drowsiness.  Advised patient not to drive if experiencing drowsiness and to take lowest possible effective dose to minimize risk of dependence and tolerance. ? ? ?Dorothyann Gibbs, NP ? ?         ?
